# Patient Record
Sex: Female | Born: 1985
Health system: Southern US, Community
[De-identification: ages and names within clinical notes are randomized; demographics above are authoritative.]

## PROBLEM LIST (undated history)

## (undated) DIAGNOSIS — F419 Anxiety disorder, unspecified: Secondary | ICD-10-CM

## (undated) DIAGNOSIS — R252 Cramp and spasm: Secondary | ICD-10-CM

## (undated) DIAGNOSIS — M545 Low back pain, unspecified: Secondary | ICD-10-CM

## (undated) DIAGNOSIS — I2699 Other pulmonary embolism without acute cor pulmonale: Secondary | ICD-10-CM

## (undated) DIAGNOSIS — M5126 Other intervertebral disc displacement, lumbar region: Secondary | ICD-10-CM

## (undated) DIAGNOSIS — D649 Anemia, unspecified: Secondary | ICD-10-CM

## (undated) DIAGNOSIS — I82409 Acute embolism and thrombosis of unspecified deep veins of unspecified lower extremity: Secondary | ICD-10-CM

## (undated) DIAGNOSIS — G8929 Other chronic pain: Secondary | ICD-10-CM

---

## 2005-11-20 ENCOUNTER — Other Ambulatory Visit: Admission: RE | Admit: 2005-11-20 | Discharge: 2005-11-20 | Payer: Self-pay | Admitting: Obstetrics and Gynecology

## 2012-03-17 ENCOUNTER — Emergency Department (HOSPITAL_COMMUNITY)
Admission: EM | Admit: 2012-03-17 | Discharge: 2012-03-17 | Disposition: A | Discharge status: Home / Self Care | Payer: BC Managed Care – PPO | Attending: Emergency Medicine | Admitting: Emergency Medicine

## 2012-03-17 ENCOUNTER — Encounter (HOSPITAL_COMMUNITY): Payer: Self-pay | Admitting: Family Medicine

## 2012-03-17 DIAGNOSIS — N760 Acute vaginitis: Secondary | ICD-10-CM | POA: Insufficient documentation

## 2012-03-17 DIAGNOSIS — A499 Bacterial infection, unspecified: Secondary | ICD-10-CM | POA: Insufficient documentation

## 2012-03-17 DIAGNOSIS — R109 Unspecified abdominal pain: Secondary | ICD-10-CM | POA: Insufficient documentation

## 2012-03-17 DIAGNOSIS — B9689 Other specified bacterial agents as the cause of diseases classified elsewhere: Secondary | ICD-10-CM | POA: Insufficient documentation

## 2012-03-17 DIAGNOSIS — R197 Diarrhea, unspecified: Secondary | ICD-10-CM | POA: Insufficient documentation

## 2012-03-17 LAB — COMPREHENSIVE METABOLIC PANEL
AST: 20 U/L (ref 0–37)
BUN: 5 mg/dL — ABNORMAL LOW (ref 6–23)
CO2: 25 mEq/L (ref 19–32)
Chloride: 103 mEq/L (ref 96–112)
Creatinine, Ser: 0.8 mg/dL (ref 0.50–1.10)
GFR calc Af Amer: >90 mL/min (ref >90)
GFR calc non Af Amer: >90 mL/min (ref >90)
Glucose, Bld: 101 mg/dL — ABNORMAL HIGH (ref 70–99)
Total Bilirubin: 0.3 mg/dL (ref 0.3–1.2)

## 2012-03-17 LAB — CBC
HCT: 42.4 % (ref 36.0–46.0)
MCV: 94.6 fL (ref 78.0–100.0)
RBC: 4.48 MIL/uL (ref 3.87–5.11)
WBC: 9.8 10*3/uL (ref 4.0–10.5)

## 2012-03-17 LAB — URINALYSIS, ROUTINE W REFLEX MICROSCOPIC
Bilirubin Urine: NEGATIVE
Hgb urine dipstick: NEGATIVE
Ketones, ur: 40 mg/dL — AB
Specific Gravity, Urine: 1.016 (ref 1.005–1.030)
pH: 6 (ref 5.0–8.0)

## 2012-03-17 LAB — WET PREP, GENITAL: Trich, Wet Prep: NONE SEEN

## 2012-03-17 LAB — URINE MICROSCOPIC-ADD ON

## 2012-03-17 MED ORDER — METRONIDAZOLE 500 MG PO TABS: 500.0000 mg | ORAL_TABLET | Freq: Two times a day (BID) | ORAL | Status: AC

## 2012-03-17 MED ORDER — DICYCLOMINE HCL 10 MG PO CAPS
20.0000 mg | ORAL_CAPSULE | Freq: Once | ORAL | Status: AC
  Administered 2012-03-17: 20 mg via ORAL
  Filled 2012-03-17: qty 2

## 2012-03-17 MED ORDER — SODIUM CHLORIDE 0.9 % IV BOLUS (SEPSIS)
1000.0000 mL | Freq: Once | INTRAVENOUS | Status: AC
  Administered 2012-03-17: 1000 mL via INTRAVENOUS

## 2012-03-17 MED ORDER — DICYCLOMINE HCL 20 MG PO TABS: 20.0000 mg | ORAL_TABLET | Freq: Four times a day (QID) | ORAL | Status: DC | PRN

## 2012-03-17 NOTE — ED Notes (Signed)
Pt sts abdominal cramping, fever and diarrhea for a few days. sts she took some immodium that helped but still cramping. Unable to eat or drink much.

## 2012-03-17 NOTE — ED Provider Notes (Signed)
History     CSN: [id number]  Arrival date & time 03/17/12  0941   First MD Initiated Contact with Patient 03/17/12 1045      Chief Complaint  Patient presents with  . Abdominal Cramping    (Consider location/radiation/quality/duration/timing/severity/associated sxs/prior treatment) The history is provided by the patient.   the patient is a healthy 26 year old G0 female with no prior abdominal surgeries who presents to the emergency department with a chief complaint of 2 days of watery diarrhea associated with chills and a fever up to 103 when the symptoms began and crampy lower abdominal pain. The pain is intermittent, becoming severe at times and radiates to the remainder of the abdomen. There is been no blood seen in the diarrhea. Fever resolved last night around 11 PM and she has had no antipyretics since that time. She denies any associated nausea, vomiting, dysuria, hematuria, vaginal discharge, vaginal pain. Last menstrual period was 4 weeks ago. Decreased appetite. No aggravating or alleviating factors. Has taken Imodium with some improvement in the diarrhea.  History reviewed. No pertinent past medical history.  History reviewed. No pertinent past surgical history.  History reviewed. No pertinent family history.  History  Substance Use Topics  . Smoking status: Never Smoker   . Smokeless tobacco: Not on file  . Alcohol Use: No     Review of Systems 10 systems reviewed and are negative for acute change except as noted in the HPI.  Allergies  Review of patient's allergies indicates no known allergies.  Home Medications   Current Outpatient Rx  Name Route Sig Dispense Refill  . LEVONORGESTREL-ETHINYL ESTRAD 0.15-30 MG-MCG PO TABS Oral Take 1 tablet by mouth daily.    Marland Kitchen OVER THE COUNTER MEDICATION Oral Take 1 tablet by mouth daily. Iron with vitamin c      BP 122/76  Pulse 98  Temp 98.5 F (36.9 C) (Oral)  Resp 20  SpO2 100%  LMP 02/15/2012  Physical Exam   Nursing note reviewed. Constitutional: She appears well-developed and well-nourished. No distress.       Vital signs are reviewed and are normal.   HENT:  Head: Normocephalic.       MMM  Eyes: Pupils are equal, round, and reactive to light.  Neck: Neck supple.  Cardiovascular: Normal rate, regular rhythm and normal heart sounds.        Bilateral radial and DP pulses are 2+   Pulmonary/Chest: Effort normal and breath sounds normal. No respiratory distress. She has no wheezes. She exhibits no tenderness.  Abdominal: Soft. Bowel sounds are increased. There is tenderness in the right lower quadrant and periumbilical area. There is no rigidity, no rebound, no guarding and negative Murphy's sign.  Genitourinary: There is no rash, tenderness or lesion on the right labia. There is no rash, tenderness or lesion on the left labia. Uterus is not tender. Cervix exhibits discharge (small amt mucoid discharge seen at closed os). Cervix exhibits no motion tenderness and no friability. Right adnexum displays no mass and no tenderness. Left adnexum displays no mass and no tenderness. No tenderness or bleeding around the vagina. No vaginal discharge found.  Musculoskeletal: She exhibits no edema.  Neurological: She is alert.  Skin: Skin is warm and dry.  Psychiatric: She has a normal mood and affect.    ED Course  Procedures (including critical care time)  Labs Reviewed  URINALYSIS, ROUTINE W REFLEX MICROSCOPIC - Abnormal; Notable for the following:    APPearance CLOUDY (*)  Ketones, ur 40 (*)     Protein, ur 30 (*)     Leukocytes, UA SMALL (*)     All other components within normal limits  COMPREHENSIVE METABOLIC PANEL - Abnormal; Notable for the following:    Glucose, Bld 101 (*)     BUN 5 (*)     All other components within normal limits  WET PREP, GENITAL - Abnormal; Notable for the following:    Clue Cells Wet Prep HPF POC MODERATE (*)     WBC, Wet Prep HPF POC MODERATE (*)     All  other components within normal limits  POCT PREGNANCY, URINE  CBC  URINE MICROSCOPIC-ADD ON  GC/CHLAMYDIA PROBE AMP, GENITAL   No results found.   1. Abdominal pain   2. Diarrhea   3. Bacterial vaginosis       MDM  Healthy young F with 2 days diarrhea and intermittent abd cramping, resolved fever. No N/V. On initial exam, moderate and RLQ TTP- no changes on re-exam. No significant findings on pelvic examination, though wet prep shows bacterial vaginosis. No UTI or hematuria. No leukocytosis or anemia. Electrolytes WNL. 1L NS in ED. Bentyl given and appears to have helped a small amount with generalized cramps though pt still with TTP.  Abd remains soft without rebound or guarding. No diarrhea in ED. Discussed at length with pt and family possibility of appendicitis vs pure diarrheal illness and risks vs benefits of CT scan. They are comfortable with d/c home and 24 hour recheck if still with symptoms. Strict return precautions discussed- pt and family voice understanding. Has tolerated PO clears in ED.        Shaaron Adler, PA-C 03/17/12 1408

## 2012-03-17 NOTE — Discharge Instructions (Signed)
As we discussed, you should be seen again in 24 hours if your symptoms continue, sooner if they worsen. Stick to clear liquids in the meantime. You can use the bentyl as needed every 6 hours for abdominal cramping. You can also use the imodium for diarrhea as needed.     Abdominal Pain Abdominal pain can be caused by many things. Your caregiver decides the seriousness of your pain by an examination and possibly blood tests and X-rays. Many cases can be observed and treated at home. Most abdominal pain is not caused by a disease and will probably improve without treatment. However, in many cases, more time must pass before a clear cause of the pain can be found. Before that point, it may not be known if you need more testing, or if hospitalization or surgery is needed. HOME CARE INSTRUCTIONS   Do not take laxatives unless directed by your caregiver.   Take pain medicine only as directed by your caregiver.   Only take over-the-counter or prescription medicines for pain, discomfort, or fever as directed by your caregiver.   Try a clear liquid diet (broth, tea, or water) for as long as directed by your caregiver. Slowly move to a bland diet as tolerated.  SEEK IMMEDIATE MEDICAL CARE IF:   The pain does not go away.   You have a fever.   You keep throwing up (vomiting).   The pain is felt only in portions of the abdomen. Pain in the right side could possibly be appendicitis. In an adult, pain in the left lower portion of the abdomen could be colitis or diverticulitis.   You pass bloody or black tarry stools.  MAKE SURE YOU:   Understand these instructions.   Will watch your condition.   Will get help right away if you are not doing well or get worse.  Document Released: 06/28/2005 Document Revised: 09/07/2011 Document Reviewed: 05/06/2008 [facility] Patient Information 2012 [place], [place].         Diarrhea Infections caused by germs (bacterial) or a virus commonly cause  diarrhea. Your caregiver has determined that with time, rest and fluids, the diarrhea should improve. In general, eat normally while drinking more water than usual. Although water may prevent dehydration, it does not contain salt and minerals (electrolytes). Broths, weak tea without caffeine and oral rehydration solutions (ORS) replace fluids and electrolytes. Small amounts of fluids should be taken frequently. Large amounts at one time may not be tolerated. Plain water may be harmful in infants and the elderly. Oral rehydrating solutions (ORS) are available at pharmacies and grocery stores. ORS replace water and important electrolytes in proper proportions. Sports drinks are not as effective as ORS and may be harmful due to sugars worsening diarrhea.  ORS is especially recommended for use in children with diarrhea. As a general guideline for children, replace any new fluid losses from diarrhea and/or vomiting with ORS as follows:   If your child weighs 22 pounds or under (10 kg or less), give 60-120 mL ( -  cup or 2 - 4 ounces) of ORS for each episode of diarrheal stool or vomiting episode.   If your child weighs more than 22 pounds (more than 10 kgs), give 120-240 mL ( - 1 cup or 4 - 8 ounces) of ORS for each diarrheal stool or episode of vomiting.   While correcting for dehydration, children should eat normally. However, foods high in sugar should be avoided because this may worsen diarrhea. Large amounts of carbonated soft  drinks, juice, gelatin desserts and other highly sugared drinks should be avoided.   After correction of dehydration, other liquids that are appealing to the child may be added. Children should drink small amounts of fluids frequently and fluids should be increased as tolerated. Children should drink enough fluids to keep urine clear or pale yellow.   Adults should eat normally while drinking more fluids than usual. Drink small amounts of fluids frequently and increase as  tolerated. Drink enough fluids to keep urine clear or pale yellow. Broths, weak decaffeinated tea, lemon lime soft drinks (allowed to go flat) and ORS replace fluids and electrolytes.   Avoid:   Carbonated drinks.   Juice.   Extremely hot or cold fluids.   Caffeine drinks.   Fatty, greasy foods.   Alcohol.   Tobacco.   Too much intake of anything at one time.   Gelatin desserts.   Probiotics are active cultures of beneficial bacteria. They may lessen the amount and number of diarrheal stools in adults. Probiotics can be found in yogurt with active cultures and in supplements.   Wash hands well to avoid spreading bacteria and virus.   Anti-diarrheal medications are not recommended for infants and children.   Only take over-the-counter or prescription medicines for pain, discomfort or fever as directed by your caregiver. Do not give aspirin to children because it may cause Reye's Syndrome.   For adults, ask your caregiver if you should continue all prescribed and over-the-counter medicines.   If your caregiver has given you a follow-up appointment, it is very important to keep that appointment. Not keeping the appointment could result in a chronic or permanent injury, and disability. If there is any problem keeping the appointment, you must call back to this facility for assistance.  SEEK IMMEDIATE MEDICAL CARE IF:   You or your child is unable to keep fluids down or other symptoms or problems become worse in spite of treatment.   Vomiting or diarrhea develops and becomes persistent.   There is vomiting of blood or bile (green material).   There is blood in the stool or the stools are black and tarry.   There is no urine output in 6-8 hours or there is only a small amount of very dark urine.   Abdominal pain develops, increases or localizes.   You have a fever.   Your baby is older than 3 months with a rectal temperature of 102 F (38.9 C) or higher.   Your baby is 20  months old or younger with a rectal temperature of 100.4 F (38 C) or higher.   You or your child develops excessive weakness, dizziness, fainting or extreme thirst.   You or your child develops a rash, stiff neck, severe headache or become irritable or sleepy and difficult to awaken.  MAKE SURE YOU:   Understand these instructions.   Will watch your condition.   Will get help right away if you are not doing well or get worse.  Document Released: 09/08/2002 Document Revised: 09/07/2011 Document Reviewed: 07/26/2009 [facility] Patient Information 2012 [place], [place].         Bacterial Vaginosis Bacterial vaginosis (BV) is a vaginal infection where the normal balance of bacteria in the vagina is disrupted. The normal balance is then replaced by an overgrowth of certain bacteria. There are several different kinds of bacteria that can cause BV. BV is the most common vaginal infection in women of childbearing age. CAUSES   The cause of BV is not  fully understood. BV develops when there is an increase or imbalance of harmful bacteria.   Some activities or behaviors can upset the normal balance of bacteria in the vagina and put women at increased risk including:   Having a new sex partner or multiple sex partners.   Douching.   Using an intrauterine device (IUD) for contraception.   It is not clear what role sexual activity plays in the development of BV. However, women that have never had sexual intercourse are rarely infected with BV.  Women do not get BV from toilet seats, bedding, swimming pools or from touching objects around them.  SYMPTOMS   Grey vaginal discharge.   A fish-like odor with discharge, especially after sexual intercourse.   Itching or burning of the vagina and vulva.   Burning or pain with urination.   Some women have no signs or symptoms at all.  DIAGNOSIS  Your caregiver must examine the vagina for signs of BV. Your caregiver will perform lab tests  and look at the sample of vaginal fluid through a microscope. They will look for bacteria and abnormal cells (clue cells), a pH test higher than 4.5, and a positive amine test all associated with BV.  RISKS AND COMPLICATIONS   Pelvic inflammatory disease (PID).   Infections following gynecology surgery.   Developing HIV.   Developing herpes virus.  TREATMENT  Sometimes BV will clear up without treatment. However, all women with symptoms of BV should be treated to avoid complications, especially if gynecology surgery is planned. Female partners generally do not need to be treated. However, BV may spread between female sex partners so treatment is helpful in preventing a recurrence of BV.   BV may be treated with antibiotics. The antibiotics come in either pill or vaginal cream forms. Either can be used with nonpregnant or pregnant women, but the recommended dosages differ. These antibiotics are not harmful to the baby.   BV can recur after treatment. If this happens, a second round of antibiotics will often be prescribed.   Treatment is important for pregnant women. If not treated, BV can cause a premature delivery, especially for a pregnant woman who had a premature birth in the past. All pregnant women who have symptoms of BV should be checked and treated.   For chronic reoccurrence of BV, treatment with a type of prescribed gel vaginally twice a week is helpful.  HOME CARE INSTRUCTIONS   Finish all medication as directed by your caregiver.   Do not have sex until treatment is completed.   Tell your sexual partner that you have a vaginal infection. They should see their caregiver and be treated if they have problems, such as a mild rash or itching.   Practice safe sex. Use condoms. Only have 1 sex partner.  PREVENTION  Basic prevention steps can help reduce the risk of upsetting the natural balance of bacteria in the vagina and developing BV:  Do not have sexual intercourse (be  abstinent).   Do not douche.   Use all of the medicine prescribed for treatment of BV, even if the signs and symptoms go away.   Tell your sex partner if you have BV. That way, they can be treated, if needed, to prevent reoccurrence.  SEEK MEDICAL CARE IF:   Your symptoms are not improving after 3 days of treatment.   You have increased discharge, pain, or fever.  MAKE SURE YOU:   Understand these instructions.   Will watch your condition.  Will get help right away if you are not doing well or get worse.  FOR MORE INFORMATION  Division of STD Prevention (DSTDP), Centers for Disease Control and Prevention: [employer] American Social Health Association (ASHA): www.ashastd.org  Document Released: 09/18/2005 Document Revised: 09/07/2011 Document Reviewed: 03/11/2009 [facility] Patient Information 2012 [place], [place].

## 2012-03-18 LAB — GC/CHLAMYDIA PROBE AMP, GENITAL
Chlamydia, DNA Probe: NEGATIVE
GC Probe Amp, Genital: NEGATIVE

## 2012-03-19 ENCOUNTER — Encounter (HOSPITAL_COMMUNITY): Payer: Self-pay | Admitting: Emergency Medicine

## 2012-03-19 ENCOUNTER — Emergency Department (HOSPITAL_COMMUNITY)
Admission: EM | Admit: 2012-03-19 | Discharge: 2012-03-19 | Disposition: A | Discharge status: Home / Self Care | Payer: BC Managed Care – PPO | Attending: Emergency Medicine | Admitting: Emergency Medicine

## 2012-03-19 DIAGNOSIS — R197 Diarrhea, unspecified: Secondary | ICD-10-CM

## 2012-03-19 DIAGNOSIS — N390 Urinary tract infection, site not specified: Secondary | ICD-10-CM | POA: Insufficient documentation

## 2012-03-19 LAB — URINALYSIS, DIPSTICK ONLY
Ketones, ur: 40 mg/dL — AB
Nitrite: POSITIVE — AB
Protein, ur: 30 mg/dL — AB
Urobilinogen, UA: 0.2 mg/dL (ref 0.0–1.0)
pH: 6 (ref 5.0–8.0)

## 2012-03-19 LAB — CBC
HCT: 38.6 % (ref 36.0–46.0)
MCHC: 35.2 g/dL (ref 30.0–36.0)
MCV: 92.6 fL (ref 78.0–100.0)
RDW: 11.9 % (ref 11.5–15.5)

## 2012-03-19 LAB — BASIC METABOLIC PANEL
Chloride: 102 mEq/L (ref 96–112)
Creatinine, Ser: 0.8 mg/dL (ref 0.50–1.10)
GFR calc Af Amer: >90 mL/min (ref >90)

## 2012-03-19 MED ORDER — CIPROFLOXACIN HCL 500 MG PO TABS
500.0000 mg | ORAL_TABLET | Freq: Once | ORAL | Status: AC
  Administered 2012-03-19: 500 mg via ORAL
  Filled 2012-03-19: qty 1

## 2012-03-19 MED ORDER — CIPROFLOXACIN HCL 500 MG PO TABS: 500.0000 mg | ORAL_TABLET | Freq: Two times a day (BID) | ORAL | Status: AC

## 2012-03-19 MED ORDER — SODIUM CHLORIDE 0.9 % IV BOLUS (SEPSIS)
1000.0000 mL | Freq: Once | INTRAVENOUS | Status: AC
  Administered 2012-03-19: 1000 mL via INTRAVENOUS

## 2012-03-19 NOTE — ED Provider Notes (Signed)
Medical screening examination/treatment/procedure(s) were conducted as a shared visit with non-physician practitioner(s) and myself.  I personally evaluated the patient during the encounter   Glynn Octave, MD 03/19/12 [phone]

## 2012-03-19 NOTE — ED Provider Notes (Signed)
Medical screening examination/treatment/procedure(s) were performed by non-physician practitioner and as supervising physician I was immediately available for consultation/collaboration.  Luan Maberry R. Sheletha Bow, MD 03/19/12 1459 

## 2012-03-19 NOTE — Discharge Instructions (Signed)
Continue taking your Flagyl. Start taking Cipro.  Take Bentyl as needed for abdominal cramping. Bloody Diarrhea Bloody diarrhea can be caused by many different conditions. Most of the time bloody diarrhea is the result of food poisoning or minor infections. Bloody diarrhea usually improves over 2 to 3 days of rest and fluid replacement. Other conditions that can cause bloody diarrhea include:  Internal bleeding.   Infection.   Diseases of the bowel and colon.  Internal bleeding from an ulcer or bowel disease can be severe and requires hospital care or even surgery. DIAGNOSIS  To find out what is wrong your caregiver may check your:  Stool.   Blood.   Results from a test that looks inside the body (endoscopy).  TREATMENT   Get plenty of rest.   Drink enough water and fluids to keep your urine clear or pale yellow.   Do not smoke.   Solid foods and dairy products should be avoided until your illness improves.   As you improve, slowly return to a regular diet with easily-digested foods first. Examples are:   Bananas.   Rice.   Toast.   Crackers.  You should only need these for about 2 days before adding more normal foods to your diet.  Avoid spicy or fatty foods as well as caffeine and alcohol for several days.   Medicine to control cramping and diarrhea can relieve symptoms but may prolong some cases of bloody diarrhea. Antibiotics can speed recovery from diarrhea due to some bacterial infections. Call your caregiver if diarrhea does not get better in 3 days.  SEEK MEDICAL CARE IF:   You do not improve after 3 days.   Your diarrhea improves but your stool appears black.  SEEK IMMEDIATE MEDICAL CARE IF:   You become extremely weak or faint.   You become very sweaty.   You have increased pain or bleeding.   You develop repeated vomiting.   You vomit and you see blood or the vomit looks black in color.   You have a fever.  Document Released: 09/18/2005 Document  Revised: 09/07/2011 Document Reviewed: 08/20/2009 [facility] Patient Information 2012 [place], [place].

## 2012-03-19 NOTE — ED Notes (Signed)
PA-C at bedside 

## 2012-03-19 NOTE — ED Provider Notes (Signed)
1:00 PM Assumed care of patient in the CDU from Dr. Manus Gunning and Dr. Rainey Pines.  Patient comes in today with a chief complaint of abdominal cramping and diarrhea that has been present for 4 days.  Today diarrhea became bloody.  Labs unremarkable.  UA showed UTI.  Plan is for patient to be hydrated and discharged home with prescription for Cipro.  Patient was seen in the ED four days ago for similar symptoms and started on Flagyl for BV and given bentyl for abdominal cramping.    3:00 PM Reassessed patient.  She reports that her abdominal cramping has improved.  Patient has been given 1 Liter IVF.  Patient in no acute distress, Heart RRR, Lungs CTAB, abdomen soft and nontender.  Stool cultures and c.diff have been sent.  Feel that patient can be discharged home.  Patient in agreement with the plan.  Pascal Lux [place], PA-C 03/19/12 1541

## 2012-03-19 NOTE — ED Provider Notes (Signed)
History     CSN: [id number]  Arrival date & time 03/19/12  7829   None     Chief Complaint  Patient presents with  . Abdominal Pain    (Consider location/radiation/quality/duration/timing/severity/associated sxs/prior treatment) HPI  26 year old female four-day history of abdominal cramping and multiple bouts of diarrhea. Seen 48 hours ago, given metronidazole to take after stomach upset resolved and also Bentyl. Patient reports her fevers have been ongoing, and she has noticed blood in her stool for the last 24 hours. She's had approximately 15 loose stools in the past 24 hours. She feels she got worse today because she was able to eat yesterday. She is again anorexic today. She denies any frank abdominal pain, just cramping. She denies any dysuria or vaginal discharge. She began her period yesterday.  She started her Flagyl last night.  On arrival vital signs were normal.  History reviewed. No pertinent past medical history.  History reviewed. No pertinent past surgical history.  No family history on file.  History  Substance Use Topics  . Smoking status: Never Smoker   . Smokeless tobacco: Not on file  . Alcohol Use: No    OB History    Grav Para Term Preterm Abortions TAB SAB Ect Mult Living                  Review of Systems Constitutional: Negative for fever and chills.  HENT: Negative for ear pain, sore throat and trouble swallowing.   Eyes: Negative for pain and visual disturbance.  Respiratory: Negative for cough and shortness of breath.   Cardiovascular: Negative for chest pain and leg swelling.  Gastrointestinal: Negative for nausea, vomiting, POS abdominal cramping and diarrhea.  Genitourinary: Negative for dysuria, urgency and frequency.  Musculoskeletal: Negative for back pain and joint swelling.  Skin: Negative for rash and wound.  Neurological: Negative for dizziness, syncope, speech difficulty, weakness and numbness.   Allergies  Review of  patient's allergies indicates no known allergies.  Home Medications   Current Outpatient Rx  Name Route Sig Dispense Refill  . DICYCLOMINE HCL 20 MG PO TABS Oral Take 20 mg by mouth every 6 (six) hours as needed. For abdominal cramping.    Marland Kitchen LEVONORGESTREL-ETHINYL ESTRAD 0.15-30 MG-MCG PO TABS Oral Take 1 tablet by mouth daily.    Marland Kitchen METRONIDAZOLE 500 MG PO TABS Oral Take 1 tablet (500 mg total) by mouth 2 (two) times daily. 14 tablet 0  . OVER THE COUNTER MEDICATION Oral Take 1 tablet by mouth daily. Iron with vitamin c    . CIPROFLOXACIN HCL 500 MG PO TABS Oral Take 1 tablet (500 mg total) by mouth 2 (two) times daily. 20 tablet 0    BP 116/71  Pulse 88  Temp 99.6 F (37.6 C)  Resp 20  SpO2 99%  LMP 02/15/2012  Physical Exam Consitutional: Pt in no acute distress.   Head: Normocephalic and atraumatic.  Eyes: Extraocular motion intact, no scleral icterus Neck: Supple without meningismus, mass, or overt JVD Respiratory: Effort normal and breath sounds normal. No respiratory distress. CV: Heart regular rate and regular rhythm (sinus), no obvious murmurs.  Pulses +2 and symmetric Abdomen: Soft, generalized mild abdominal tenderness to palpation, non-distended. No rebound or guarding. No focal tenderness anywhere. MSK: Extremities are atraumatic without deformity, ROM intact Skin: Warm, dry, intact Neuro: Alert and oriented, no motor deficit noted.   Psychiatric: Mood and affect are normal    ED Course  Procedures (including critical care time)  Labs  Reviewed  BASIC METABOLIC PANEL - Abnormal; Notable for the following:    BUN 5 (*)     All other components within normal limits  URINALYSIS, DIPSTICK ONLY - Abnormal; Notable for the following:    Bilirubin Urine SMALL (*)     Ketones, ur 40 (*)     Protein, ur 30 (*)     Nitrite POSITIVE (*)     Leukocytes, UA SMALL (*)     All other components within normal limits  CBC  CLOSTRIDIUM DIFFICILE BY PCR  STOOL CULTURE    No results found.   1. Diarrhea   2. UTI (lower urinary tract infection)       MDM  Not at all consistent with appendicitis. Nonlateralizing discomfort. Generalized crampy pain, and generalized very mild tenderness to palpation. Not consistent with GU disease. Laboratories, stool cultures, antibiotics.  Urine has resulted suggesting urinary tract infection. Patient also has ketones in her urine. We'll treat with IV fluids as well as ciprofloxacin. Patient to the CDU for further treatment and management.         Larrie Kass, MD 03/19/12 [phone]

## 2012-03-19 NOTE — ED Notes (Signed)
Pt unable to have BM at this time.  States will try later.

## 2012-03-19 NOTE — ED Notes (Signed)
Discharged via teach back 

## 2012-03-19 NOTE — ED Notes (Signed)
Pt. Stated, pt. Here on Sun with the same symptoms.  Abdominal pain, and diarrhea

## 2012-03-21 NOTE — ED Provider Notes (Signed)
I saw and evaluated the patient, reviewed the resident's note and I agree with the findings and plan.  4 days of abdominal cramping and diarrhea, today with streaks of blood. Abdomen soft. Mucus membranes moist.  Glynn Octave, MD 03/21/12 [phone]

## 2012-03-23 LAB — STOOL CULTURE

## 2013-06-19 ENCOUNTER — Other Ambulatory Visit: Payer: Self-pay | Admitting: Chiropractic Medicine

## 2013-06-19 DIAGNOSIS — M545 Low back pain, unspecified: Secondary | ICD-10-CM

## 2013-06-24 ENCOUNTER — Ambulatory Visit
Admission: RE | Admit: 2013-06-24 | Discharge: 2013-06-24 | Disposition: A | Discharge status: Home / Self Care | Payer: BC Managed Care – PPO | Source: Ambulatory Visit | Attending: Chiropractic Medicine | Admitting: Chiropractic Medicine

## 2013-06-24 DIAGNOSIS — M545 Low back pain, unspecified: Secondary | ICD-10-CM

## 2014-10-12 ENCOUNTER — Encounter: Payer: Self-pay | Admitting: *Deleted

## 2015-07-03 LAB — HM PAP SMEAR

## 2015-11-14 NOTE — H&P (Addendum)
Catalina Jerilynn Mages April is an 30 y.o. female with ovarian mass presents for surgical management.    No past medical history on file.  Past Surgical History  Procedure Laterality Date  . No past surgeries      Family History  Problem Relation Age of Onset  . Atrial fibrillation Father   . Atrial fibrillation Paternal Grandmother     Social History:  reports that she has never smoked. She has never used smokeless tobacco. She reports that she does not drink alcohol or use illicit drugs.  Allergies: No Known Allergies  Meds:  Celexa, loloestrin  ROS  AF, VSS  Physical Exam  Gen - NAD ABd - soft, NT/ND Ext - NT, no edema PV - uterus mobile NT.  No definite mass palpated, possible right adnexal fullness  PV [place]:  6.4x5cm adnexal mass to the right of midline, suggestive of dermoid.  Assessment/Plan: Adnexal mass, possible dermoid Plan for exploratory laparotomy, ovarian cystectomy, possible oophorectomy - suspect right R/b/a discussed, questions answered, informed consent  Ameah Chanda 11/14/2015, 12:07 PM

## 2015-11-18 NOTE — Patient Instructions (Signed)
Your procedure is scheduled on:  Wednesday, Feb. 22, 2017  Enter through the [employer] [facility] at: 6:00 A.M.  Pick up the phone at the desk and dial [phone].  Call this number if you have problems the morning of surgery: [phone].  Remember:  Do NOT eat food or drink after:  Midnight Tuesday  Take these medicines the morning of surgery with a SIP OF WATER: Celexa  Do NOT wear jewelry (body piercing), metal hair clips/bobby pins, make-up, or nail polish. Do NOT wear lotions, powders, or perfumes.  You may wear deoderant. Do NOT shave for 48 hours prior to surgery. Do NOT bring valuables to the hospital. Contacts, dentures, or bridgework may not be worn into surgery.  Leave suitcase in car.  After surgery it may be brought to your room.  For patients admitted to the hospital, checkout time is 11:00 AM the day of discharge.

## 2015-11-19 ENCOUNTER — Encounter (HOSPITAL_COMMUNITY): Payer: Self-pay

## 2015-11-19 ENCOUNTER — Encounter (HOSPITAL_COMMUNITY)
Admission: RE | Admit: 2015-11-19 | Discharge: 2015-11-19 | Disposition: A | Discharge status: Home / Self Care | Payer: BLUE CROSS/BLUE SHIELD | Source: Ambulatory Visit | Attending: Obstetrics and Gynecology | Admitting: Obstetrics and Gynecology

## 2015-11-19 DIAGNOSIS — Z01812 Encounter for preprocedural laboratory examination: Secondary | ICD-10-CM | POA: Diagnosis not present

## 2015-11-19 HISTORY — DX: Anemia, unspecified: D64.9

## 2015-11-19 HISTORY — DX: Anxiety disorder, unspecified: F41.9

## 2015-11-19 LAB — TYPE AND SCREEN
ABO/RH(D): A NEG
Antibody Screen: NEGATIVE

## 2015-11-19 LAB — CBC
HEMATOCRIT: 39.8 % (ref 36.0–46.0)
HEMOGLOBIN: 13.5 g/dL (ref 12.0–15.0)
MCH: 32.9 pg (ref 26.0–34.0)
MCHC: 33.9 g/dL (ref 30.0–36.0)
MCV: 97.1 fL (ref 78.0–100.0)
Platelets: 253 10*3/uL (ref 150–400)
RBC: 4.1 MIL/uL (ref 3.87–5.11)
RDW: 12.4 % (ref 11.5–15.5)
WBC: 5.3 10*3/uL (ref 4.0–10.5)

## 2015-11-19 LAB — ABO/RH: ABO/RH(D): A NEG

## 2015-11-23 ENCOUNTER — Encounter (HOSPITAL_COMMUNITY): Payer: Self-pay | Admitting: Anesthesiology

## 2015-11-23 MED ORDER — CEFOTETAN DISODIUM 2 G IJ SOLR
2.0000 g | INTRAMUSCULAR | Status: AC
  Administered 2015-11-24: 2 g via INTRAVENOUS
  Filled 2015-11-23: qty 2

## 2015-11-23 NOTE — Anesthesia Preprocedure Evaluation (Addendum)
Anesthesia Evaluation  Patient identified by MRN, date of birth, ID band Patient awake    Reviewed: Allergy & Precautions, H&P , NPO status , Patient's Chart, lab work & pertinent test results  Airway Mallampati: I  TM Distance: >3 FB Neck ROM: full    Dental no notable dental hx. (+) Teeth Intact   Pulmonary neg pulmonary ROS,    Pulmonary exam normal        Cardiovascular negative cardio ROS Normal cardiovascular exam     Neuro/Psych negative neurological ROS     GI/Hepatic negative GI ROS, Neg liver ROS,   Endo/Other  negative endocrine ROS  Renal/GU negative Renal ROS     Musculoskeletal   Abdominal (+) + obese,   Peds  Hematology   Anesthesia Other Findings   Reproductive/Obstetrics negative OB ROS                             Anesthesia Physical Anesthesia Plan  ASA: II  Anesthesia Plan: General   Post-op Pain Management:    Induction: Intravenous  Airway Management Planned: Oral ETT  Additional Equipment:   Intra-op Plan:   Post-operative Plan: Extubation in OR  Informed Consent: I have reviewed the patients History and Physical, chart, labs and discussed the procedure including the risks, benefits and alternatives for the proposed anesthesia with the patient or authorized representative who has indicated his/her understanding and acceptance.     Plan Discussed with: CRNA and Surgeon  Anesthesia Plan Comments:        Anesthesia Quick Evaluation

## 2015-11-24 ENCOUNTER — Encounter (HOSPITAL_COMMUNITY)
Admission: RE | Disposition: A | Discharge status: Home / Self Care | Payer: Self-pay | Source: Ambulatory Visit | Attending: Obstetrics and Gynecology

## 2015-11-24 ENCOUNTER — Inpatient Hospital Stay (HOSPITAL_COMMUNITY): Payer: BLUE CROSS/BLUE SHIELD | Admitting: Anesthesiology

## 2015-11-24 ENCOUNTER — Inpatient Hospital Stay (HOSPITAL_COMMUNITY)
Admission: RE | Admit: 2015-11-24 | Discharge: 2015-11-26 | DRG: 743 | Disposition: A | Discharge status: Home / Self Care | Payer: BLUE CROSS/BLUE SHIELD | Source: Ambulatory Visit | Attending: Obstetrics and Gynecology | Admitting: Obstetrics and Gynecology

## 2015-11-24 ENCOUNTER — Encounter (HOSPITAL_COMMUNITY): Payer: Self-pay | Admitting: Anesthesiology

## 2015-11-24 DIAGNOSIS — D369 Benign neoplasm, unspecified site: Secondary | ICD-10-CM | POA: Diagnosis present

## 2015-11-24 DIAGNOSIS — D270 Benign neoplasm of right ovary: Principal | ICD-10-CM | POA: Diagnosis present

## 2015-11-24 DIAGNOSIS — N839 Noninflammatory disorder of ovary, fallopian tube and broad ligament, unspecified: Secondary | ICD-10-CM | POA: Diagnosis present

## 2015-11-24 HISTORY — PX: DERMOID CYST  EXCISION: SHX1452

## 2015-11-24 HISTORY — PX: LAPAROTOMY: SHX154

## 2015-11-24 LAB — PREGNANCY, URINE: PREG TEST UR: NEGATIVE

## 2015-11-24 SURGERY — LAPAROTOMY
Anesthesia: General | Laterality: Right

## 2015-11-24 MED ORDER — PROPOFOL 10 MG/ML IV BOLUS
INTRAVENOUS | Status: DC | PRN
  Administered 2015-11-24: 20 mg via INTRAVENOUS
  Administered 2015-11-24: 180 mg via INTRAVENOUS

## 2015-11-24 MED ORDER — SCOPOLAMINE 1 MG/3DAYS TD PT72
MEDICATED_PATCH | TRANSDERMAL | Status: AC
  Administered 2015-11-24: 1.5 mg via TRANSDERMAL
  Filled 2015-11-24: qty 1

## 2015-11-24 MED ORDER — MEPERIDINE HCL 25 MG/ML IJ SOLN: 6.2500 mg | INTRAMUSCULAR | Status: DC | PRN

## 2015-11-24 MED ORDER — KETOROLAC TROMETHAMINE 30 MG/ML IJ SOLN
INTRAMUSCULAR | Status: DC | PRN
  Administered 2015-11-24: 30 mg via INTRAVENOUS

## 2015-11-24 MED ORDER — LIDOCAINE HCL (CARDIAC) 20 MG/ML IV SOLN
INTRAVENOUS | Status: AC
  Filled 2015-11-24: qty 5

## 2015-11-24 MED ORDER — KETOROLAC TROMETHAMINE 30 MG/ML IJ SOLN
INTRAMUSCULAR | Status: AC
  Filled 2015-11-24: qty 1

## 2015-11-24 MED ORDER — DEXTROSE IN LACTATED RINGERS 5 % IV SOLN
INTRAVENOUS | Status: DC
  Administered 2015-11-24 (×2): via INTRAVENOUS

## 2015-11-24 MED ORDER — OXYCODONE-ACETAMINOPHEN 5-325 MG PO TABS
1.0000 | ORAL_TABLET | ORAL | Status: DC | PRN
  Administered 2015-11-25 – 2015-11-26 (×7): 1 via ORAL
  Filled 2015-11-24 (×7): qty 1

## 2015-11-24 MED ORDER — ROCURONIUM BROMIDE 100 MG/10ML IV SOLN
INTRAVENOUS | Status: AC
  Filled 2015-11-24: qty 1

## 2015-11-24 MED ORDER — FENTANYL CITRATE (PF) 100 MCG/2ML IJ SOLN
INTRAMUSCULAR | Status: DC | PRN
  Administered 2015-11-24 (×3): 50 ug via INTRAVENOUS

## 2015-11-24 MED ORDER — ONDANSETRON HCL 4 MG/2ML IJ SOLN: 4.0000 mg | Freq: Four times a day (QID) | INTRAMUSCULAR | Status: DC | PRN

## 2015-11-24 MED ORDER — PROMETHAZINE HCL 25 MG/ML IJ SOLN: 6.2500 mg | INTRAMUSCULAR | Status: DC | PRN

## 2015-11-24 MED ORDER — NALOXONE HCL 0.4 MG/ML IJ SOLN: 0.4000 mg | INTRAMUSCULAR | Status: DC | PRN

## 2015-11-24 MED ORDER — HYDROMORPHONE HCL 1 MG/ML IJ SOLN
INTRAMUSCULAR | Status: AC
  Administered 2015-11-24: 0.5 mg via INTRAVENOUS
  Filled 2015-11-24: qty 1

## 2015-11-24 MED ORDER — ROCURONIUM BROMIDE 100 MG/10ML IV SOLN
INTRAVENOUS | Status: DC | PRN
  Administered 2015-11-24: 35 mg via INTRAVENOUS

## 2015-11-24 MED ORDER — GLYCOPYRROLATE 0.2 MG/ML IJ SOLN
INTRAMUSCULAR | Status: DC | PRN
  Administered 2015-11-24: 0.6 mg via INTRAVENOUS

## 2015-11-24 MED ORDER — DEXAMETHASONE SODIUM PHOSPHATE 4 MG/ML IJ SOLN
INTRAMUSCULAR | Status: AC
  Filled 2015-11-24: qty 1

## 2015-11-24 MED ORDER — MENTHOL 3 MG MT LOZG: 1.0000 | LOZENGE | OROMUCOSAL | Status: DC | PRN

## 2015-11-24 MED ORDER — ONDANSETRON HCL 4 MG/2ML IJ SOLN
INTRAMUSCULAR | Status: DC | PRN
  Administered 2015-11-24: 4 mg via INTRAVENOUS

## 2015-11-24 MED ORDER — KETOROLAC TROMETHAMINE 30 MG/ML IJ SOLN: 30.0000 mg | Freq: Once | INTRAMUSCULAR | Status: DC

## 2015-11-24 MED ORDER — DIPHENHYDRAMINE HCL 50 MG/ML IJ SOLN
12.5000 mg | Freq: Four times a day (QID) | INTRAMUSCULAR | Status: DC | PRN
  Administered 2015-11-24: 12.5 mg via INTRAVENOUS
  Filled 2015-11-24: qty 1

## 2015-11-24 MED ORDER — GLYCOPYRROLATE 0.2 MG/ML IJ SOLN
INTRAMUSCULAR | Status: AC
  Filled 2015-11-24: qty 3

## 2015-11-24 MED ORDER — MIDAZOLAM HCL 2 MG/2ML IJ SOLN
INTRAMUSCULAR | Status: AC
  Filled 2015-11-24: qty 2

## 2015-11-24 MED ORDER — HYDROMORPHONE HCL 1 MG/ML IJ SOLN
0.2500 mg | INTRAMUSCULAR | Status: DC | PRN
  Administered 2015-11-24: 0.25 mg via INTRAVENOUS
  Administered 2015-11-24 (×2): 0.5 mg via INTRAVENOUS
  Administered 2015-11-24: 0.25 mg via INTRAVENOUS
  Administered 2015-11-24: 0.5 mg via INTRAVENOUS

## 2015-11-24 MED ORDER — HYDROMORPHONE 1 MG/ML IV SOLN
INTRAVENOUS | Status: DC
  Administered 2015-11-24: 6.6 mg via INTRAVENOUS
  Administered 2015-11-24: 3.8 mL via INTRAVENOUS
  Administered 2015-11-24: 10:00:00 via INTRAVENOUS
  Administered 2015-11-25: 6.8 mg via INTRAVENOUS
  Filled 2015-11-24: qty 25

## 2015-11-24 MED ORDER — NEOSTIGMINE METHYLSULFATE 10 MG/10ML IV SOLN
INTRAVENOUS | Status: AC
Start: 2015-11-24 — End: 2015-11-24
  Filled 2015-11-24: qty 1

## 2015-11-24 MED ORDER — CITALOPRAM HYDROBROMIDE 20 MG PO TABS
20.0000 mg | ORAL_TABLET | Freq: Every day | ORAL | Status: DC
  Administered 2015-11-25: 20 mg via ORAL
  Filled 2015-11-24 (×2): qty 1

## 2015-11-24 MED ORDER — PROPOFOL 10 MG/ML IV BOLUS
INTRAVENOUS | Status: AC
  Filled 2015-11-24: qty 20

## 2015-11-24 MED ORDER — 0.9 % SODIUM CHLORIDE (POUR BTL) OPTIME
TOPICAL | Status: DC | PRN
  Administered 2015-11-24: 2000 mL

## 2015-11-24 MED ORDER — SODIUM CHLORIDE 0.9% FLUSH: 9.0000 mL | INTRAVENOUS | Status: DC | PRN

## 2015-11-24 MED ORDER — LIDOCAINE HCL (CARDIAC) 20 MG/ML IV SOLN
INTRAVENOUS | Status: DC | PRN
  Administered 2015-11-24: 30 mg via INTRAVENOUS
  Administered 2015-11-24: 70 mg via INTRAVENOUS

## 2015-11-24 MED ORDER — FENTANYL CITRATE (PF) 250 MCG/5ML IJ SOLN
INTRAMUSCULAR | Status: AC
  Filled 2015-11-24: qty 5

## 2015-11-24 MED ORDER — DEXTROSE-NACL 5-0.45 % IV SOLN: INTRAVENOUS | Status: DC

## 2015-11-24 MED ORDER — NORETHIN-ETH ESTRAD-FE BIPHAS 1 MG-10 MCG / 10 MCG PO TABS
1.0000 | ORAL_TABLET | Freq: Every day | ORAL | Status: DC
  Administered 2015-11-25: 1 via ORAL
  Filled 2015-11-24: qty 1

## 2015-11-24 MED ORDER — KETOROLAC TROMETHAMINE 30 MG/ML IJ SOLN
30.0000 mg | Freq: Four times a day (QID) | INTRAMUSCULAR | Status: AC
  Administered 2015-11-24 (×2): 30 mg via INTRAVENOUS
  Filled 2015-11-24 (×2): qty 1

## 2015-11-24 MED ORDER — HYDROMORPHONE HCL 1 MG/ML IJ SOLN
INTRAMUSCULAR | Status: AC
  Administered 2015-11-24: 0.25 mg via INTRAVENOUS
  Filled 2015-11-24: qty 1

## 2015-11-24 MED ORDER — MIDAZOLAM HCL 2 MG/2ML IJ SOLN
INTRAMUSCULAR | Status: DC | PRN
  Administered 2015-11-24: 1 mg via INTRAVENOUS

## 2015-11-24 MED ORDER — SCOPOLAMINE 1 MG/3DAYS TD PT72
1.0000 | MEDICATED_PATCH | Freq: Once | TRANSDERMAL | Status: DC
  Administered 2015-11-24: 1.5 mg via TRANSDERMAL

## 2015-11-24 MED ORDER — DEXAMETHASONE SODIUM PHOSPHATE 10 MG/ML IJ SOLN
INTRAMUSCULAR | Status: DC | PRN
  Administered 2015-11-24: 4 mg via INTRAVENOUS

## 2015-11-24 MED ORDER — ONDANSETRON HCL 4 MG/2ML IJ SOLN
INTRAMUSCULAR | Status: AC
  Filled 2015-11-24: qty 2

## 2015-11-24 MED ORDER — ONDANSETRON HCL 4 MG PO TABS: 4.0000 mg | ORAL_TABLET | Freq: Four times a day (QID) | ORAL | Status: DC | PRN

## 2015-11-24 MED ORDER — DIPHENHYDRAMINE HCL 12.5 MG/5ML PO ELIX
12.5000 mg | ORAL_SOLUTION | Freq: Four times a day (QID) | ORAL | Status: DC | PRN
  Administered 2015-11-24 – 2015-11-25 (×2): 12.5 mg via ORAL
  Filled 2015-11-24 (×2): qty 5

## 2015-11-24 MED ORDER — NEOSTIGMINE METHYLSULFATE 10 MG/10ML IV SOLN
INTRAVENOUS | Status: DC | PRN
  Administered 2015-11-24: 4 mg via INTRAVENOUS

## 2015-11-24 MED ORDER — LEVONORGESTREL-ETHINYL ESTRAD 0.15-30 MG-MCG PO TABS: 1.0000 | ORAL_TABLET | Freq: Every day | ORAL | Status: DC

## 2015-11-24 MED ORDER — LACTATED RINGERS IV SOLN
INTRAVENOUS | Status: DC
  Administered 2015-11-24 (×2): via INTRAVENOUS

## 2015-11-24 SURGICAL SUPPLY — 33 items
BLADE SURG 10 STRL SS (BLADE) ×6 IMPLANT
CANISTER SUCT 3000ML (MISCELLANEOUS) ×3 IMPLANT
CHLORAPREP W/TINT 26ML (MISCELLANEOUS) ×3 IMPLANT
CLOTH BEACON ORANGE TIMEOUT ST (SAFETY) ×3 IMPLANT
CONT PATH 16OZ SNAP LID 3702 (MISCELLANEOUS) ×3 IMPLANT
DRAPE WARM FLUID 44X44 (DRAPE) ×3 IMPLANT
DRSG OPSITE POSTOP 4X10 (GAUZE/BANDAGES/DRESSINGS) ×3 IMPLANT
GAUZE SPONGE 4X4 16PLY XRAY LF (GAUZE/BANDAGES/DRESSINGS) IMPLANT
GLOVE BIO SURGEON STRL SZ 6.5 (GLOVE) ×3 IMPLANT
GLOVE BIOGEL PI IND STRL 7.0 (GLOVE) ×4 IMPLANT
GLOVE BIOGEL PI INDICATOR 7.0 (GLOVE) ×2
GOWN STRL REUS W/TWL LRG LVL3 (GOWN DISPOSABLE) ×9 IMPLANT
HEMOSTAT SURGICEL 4X8 (HEMOSTASIS) IMPLANT
NS IRRIG 1000ML POUR BTL (IV SOLUTION) ×6 IMPLANT
PACK ABDOMINAL GYN (CUSTOM PROCEDURE TRAY) ×3 IMPLANT
PAD OB MATERNITY 4.3X12.25 (PERSONAL CARE ITEMS) ×3 IMPLANT
PENCIL SMOKE EVAC W/HOLSTER (ELECTROSURGICAL) ×3 IMPLANT
PROTECTOR NERVE ULNAR (MISCELLANEOUS) ×3 IMPLANT
SPONGE LAP 18X18 X RAY DECT (DISPOSABLE) ×6 IMPLANT
STAPLER VISISTAT 35W (STAPLE) IMPLANT
SUT CHROMIC 2 0 TIES 18 (SUTURE) IMPLANT
SUT MON AB-0 CT1 36 (SUTURE) IMPLANT
SUT PDS AB 0 CTX 60 (SUTURE) ×6 IMPLANT
SUT PLAIN 2 0 (SUTURE)
SUT PLAIN 2 0 XLH (SUTURE) IMPLANT
SUT PLAIN ABS 2-0 54XMFL TIE (SUTURE) IMPLANT
SUT VIC AB 0 CT1 18XCR BRD8 (SUTURE) ×4 IMPLANT
SUT VIC AB 0 CT1 8-18 (SUTURE) ×2
SUT VIC AB 4-0 KS 27 (SUTURE) ×3 IMPLANT
SUT VICRYL 0 TIES 12 18 (SUTURE) ×3 IMPLANT
TOWEL OR 17X24 6PK STRL BLUE (TOWEL DISPOSABLE) ×6 IMPLANT
TRAY FOLEY CATH SILVER 14FR (SET/KITS/TRAYS/PACK) ×3 IMPLANT
WATER STERILE IRR 1000ML POUR (IV SOLUTION) ×3 IMPLANT

## 2015-11-24 NOTE — Anesthesia Postprocedure Evaluation (Signed)
Anesthesia Post Note  Patient: Gail Foster  Procedure(s) Performed: Procedure(s) (LRB): LAPAROTOMY RIGHT OVARIAN CYSTECTOMY (Right)  Patient location during evaluation: [facility] Anesthesia Type: General Level of consciousness: awake and alert and oriented Pain management: pain level controlled Vital Signs Assessment: post-procedure vital signs reviewed and stable Respiratory status: spontaneous breathing Cardiovascular status: blood pressure returned to baseline and stable Postop Assessment: adequate PO intake Anesthetic complications: no    Last Vitals:  Filed Vitals:   11/24/15 1300 11/24/15 1341  BP: 115/66   Pulse: 70   Temp:    Resp: 16 16    Last Pain:  Filed Vitals:   11/24/15 1342  PainSc: 3                  Chole Driver R

## 2015-11-24 NOTE — Addendum Note (Signed)
Addendum  created 11/24/15 1353 by Bufford Spikes, CRNA   Modules edited: Clinical Notes   Clinical Notes:  File: [id number]

## 2015-11-24 NOTE — Anesthesia Postprocedure Evaluation (Signed)
Anesthesia Post Note  Patient: Gail Foster  Procedure(s) Performed: Procedure(s) (LRB): LAPAROTOMY RIGHT OVARIAN CYSTECTOMY (Right)  Patient location during evaluation: PACU Anesthesia Type: General Level of consciousness: awake and alert Pain management: pain level controlled Vital Signs Assessment: post-procedure vital signs reviewed and stable Respiratory status: spontaneous breathing, nonlabored ventilation, respiratory function stable and patient connected to nasal cannula oxygen Cardiovascular status: blood pressure returned to baseline and stable Postop Assessment: no signs of nausea or vomiting Anesthetic complications: no    Last Vitals:  Filed Vitals:   11/24/15 0915 11/24/15 0930  BP: 103/66 111/59  Pulse:    Temp:    Resp:      Last Pain:  Filed Vitals:   11/24/15 0931  PainSc: 5                  Jaisa Defino J

## 2015-11-24 NOTE — Anesthesia Procedure Notes (Signed)
Procedure Name: Intubation Date/Time: 11/24/2015 7:29 AM Performed by: Tobin Chad Pre-anesthesia Checklist: Patient identified, Emergency Drugs available, Suction available and Patient being monitored Patient Re-evaluated:Patient Re-evaluated prior to inductionOxygen Delivery Method: Circle system utilized and Simple face mask Preoxygenation: Pre-oxygenation with 100% oxygen Intubation Type: IV induction and Inhalational induction Ventilation: Mask ventilation without difficulty Laryngoscope Size: Mac and 3 Grade View: Grade II Tube type: Oral Tube size: 7.0 mm Number of attempts: 2 Airway Equipment and Method: Stylet Placement Confirmation: ETT inserted through vocal cords under direct vision,  positive ETCO2 and CO2 detector Secured at: 22 cm Tube secured with: Tape Dental Injury: Teeth and Oropharynx as per pre-operative assessment

## 2015-11-24 NOTE — Addendum Note (Signed)
Addendum  created 11/24/15 1350 by Bufford Spikes, CRNA   Modules edited: Clinical Notes   Clinical Notes:  File: [id number]; [place]: [id number]

## 2015-11-24 NOTE — Transfer of Care (Signed)
Immediate Anesthesia Transfer of Care Note  Patient: [facility]  Procedure(s) Performed: Procedure(s): LAPAROTOMY RIGHT OVARIAN CYSTECTOMY (Right)  Patient Location: PACU  Anesthesia Type:General  Level of Consciousness: awake, alert , oriented and patient cooperative  Airway & Oxygen Therapy: Patient Spontanous Breathing and Patient connected to nasal cannula oxygen  Post-op Assessment: Report given to RN and Post -op Vital signs reviewed and stable  Post vital signs: Reviewed and stable  Last Vitals:  Filed Vitals:   11/24/15 0600  BP: 134/91  Pulse: 87  Temp: 36.6 C  Resp: 20    Complications: No apparent anesthesia complications

## 2015-11-24 NOTE — Anesthesia Postprocedure Evaluation (Signed)
Anesthesia Post Note  Patient: KAITHLYN BESA  Procedure(s) Performed: Procedure(s) (LRB): LAPAROTOMY RIGHT OVARIAN CYSTECTOMY (Right)  Patient location during evaluation: Women's Unit Anesthesia Type: General Level of consciousness: awake and alert and oriented Pain management: pain level controlled Vital Signs Assessment: post-procedure vital signs reviewed and stable Respiratory status: spontaneous breathing Cardiovascular status: blood pressure returned to baseline and stable Postop Assessment: adequate PO intake Anesthetic complications: no    Last Vitals:  Filed Vitals:   11/24/15 1300 11/24/15 1341  BP: 115/66   Pulse: 70   Temp:    Resp: 16 16    Last Pain:  Filed Vitals:   11/24/15 1342  PainSc: 3                  Channing Yeager R

## 2015-11-24 NOTE — Progress Notes (Signed)
Day of Surgery Procedure(s) (LRB): LAPAROTOMY RIGHT OVARIAN CYSTECTOMY (Right)  Subjective: Patient reports incisional pain and tolerating PO.    Objective: I have reviewed patient's vital signs, intake and output and medications.  General: alert and cooperative GI: normal findings: soft, non-tender Extremities: extremities normal, atraumatic, no cyanosis or edema  Assessment: s/p Procedure(s): LAPAROTOMY RIGHT OVARIAN CYSTECTOMY (Right): stable  Plan: Advance diet Encourage ambulation  LOS: 0 days    Apryle Stowell 11/24/2015, 1:54 PM

## 2015-11-24 NOTE — Op Note (Signed)
Gail Foster, ONDER NO.:  [id number]  MEDICAL RECORD NO.:  [id number]  LOCATION:  [facility]                          FACILITY:  [facility]  PHYSICIAN:  Marylynn Pearson, MD    DATE OF BIRTH:  [birth year]  DATE OF PROCEDURE: DATE OF DISCHARGE:                              OPERATIVE REPORT   PREOPERATIVE DIAGNOSIS:  Right ovarian mass.  POSTOPERATIVE DIAGNOSIS:  Right ovarian mass, suspect dermoid.  SURGERY:  Laparotomy with right ovarian cystectomy.  SURGEON:  Marylynn Pearson, MD.  ASSISTANT:  Purnell Shoemaker.  ESTIMATED BLOOD LOSS:  Minimal.  URINE OUTPUT:  250 mL, clear.  SPECIMEN:  Right ovarian cyst, suspect dermoid.  COMPLICATIONS:  None.  CONDITION:  Stable to recovery room.  PROCEDURE IN DETAIL:  The patient was taken to the operating room where anesthesia was found to be adequate.  She was placed in the supine position, prepped and draped in sterile fashion, and a Foley catheter was inserted.  A small Pfannenstiel incision was made and extended to the level of the fascia.  The fascia was incised in the midline and extended laterally using curved Mayo scissors.  Underlying rectus muscles were dissected free, and the peritoneum was entered sharply. Right dermoid was identified and delivered through the incision.  A moist laparotomy sponge was placed around the ovary.  A small incision over the ovary was made with a scalpel, and the right ovarian dermoid was shelled out using sharp and blunt dissection.  Small amount of mucinous drainage occurred from the dermoid but captured by the laparotomy sponge.  The base of the ovary was cauterized, any small bleeders were cauterized using the Bovie.  The ovary was reapproximated using Vicryl.  Irrigation was performed. The fascia was closed with PDS.  Subcutaneous tissue was reapproximated with Vicryl, and the skin was closed with 4-0 Vicryl.  Dermabond and skin glue were placed.  The patient was extubated and taken to  the recovery room in stable condition.  Sponge, lap, needle, and instrument counts were correct x2.     Marylynn Pearson, MD     GA/MEDQ  D:  11/24/2015  T:  11/24/2015  Job:  [id number]

## 2015-11-25 ENCOUNTER — Encounter (HOSPITAL_COMMUNITY): Payer: Self-pay | Admitting: Obstetrics and Gynecology

## 2015-11-25 LAB — CBC
HCT: 32.7 % — ABNORMAL LOW (ref 36.0–46.0)
HEMOGLOBIN: 11.1 g/dL — AB (ref 12.0–15.0)
MCH: 32.8 pg (ref 26.0–34.0)
MCHC: 33.9 g/dL (ref 30.0–36.0)
MCV: 96.7 fL (ref 78.0–100.0)
PLATELETS: 200 10*3/uL (ref 150–400)
RBC: 3.38 MIL/uL — ABNORMAL LOW (ref 3.87–5.11)
RDW: 12 % (ref 11.5–15.5)
WBC: 8.3 10*3/uL (ref 4.0–10.5)

## 2015-11-25 MED ORDER — IBUPROFEN 600 MG PO TABS
600.0000 mg | ORAL_TABLET | Freq: Four times a day (QID) | ORAL | Status: DC | PRN
  Administered 2015-11-25 – 2015-11-26 (×4): 600 mg via ORAL
  Filled 2015-11-25 (×4): qty 1

## 2015-11-25 NOTE — Progress Notes (Signed)
1 Day Post-Op Procedure(s) (LRB): LAPAROTOMY RIGHT OVARIAN CYSTECTOMY (Right)  Subjective: Patient reports tolerating PO, + flatus and no problems voiding.    Objective: I have reviewed patient's vital signs, intake and output and medications.  General: alert and cooperative GI: normal findings: soft, non-tender  Assessment: s/p Procedure(s): LAPAROTOMY RIGHT OVARIAN CYSTECTOMY (Right): stable and progressing well  Plan: Advance diet Encourage ambulation Advance to PO medication Discontinue IV fluids  LOS: 1 day    Nicholes Hibler 11/25/2015, 7:45 AM

## 2015-11-26 MED ORDER — IBUPROFEN 600 MG PO TABS: 600.0000 mg | ORAL_TABLET | Freq: Four times a day (QID) | ORAL | Status: DC | PRN

## 2015-11-26 MED ORDER — OXYCODONE-ACETAMINOPHEN 5-325 MG PO TABS: 1.0000 | ORAL_TABLET | ORAL | Status: DC | PRN

## 2015-11-26 NOTE — Discharge Instructions (Signed)
Exploratory Laparotomy, Adult  Exploratory laparotomy is a surgical procedure to examine the organs inside your belly (abdomen). Another name for this is abdominal exploration. You may have this procedure if you have abdominal pain, trauma, bleeding, infection, or obstruction. The procedure may be done if your health care provider cannot make a diagnosis from only an exam and testing.  Exploratory laparotomy may be a planned procedure or an emergency procedure. You may have surgical treatment as part of the laparotomy, or you may have additional treatment after your laparotomy. This will depend on what your surgeon finds during the procedure.  LET YOUR HEALTH CARE PROVIDER KNOW ABOUT:   Any allergies you have.   All medicines you are taking, including vitamins, herbs, eye drops, creams, and over-the-counter medicines.   Previous problems you or members of your family have had with the use of anesthetics.   Any blood disorders you have.   Previous surgeries you have had.   Medical conditions you have.  RISKS AND COMPLICATIONS  Generally, this is a safe procedure. However, problems can occur and include:   Bleeding.   Infection.   A blood clot that forms in your leg and travels to your lungs.   Damage to organs inside your abdomen.   Scar tissue that blocks your digestive tract.  BEFORE THE PROCEDURE   Ask your health care provider about:   Changing or stopping your regular medicines. This is especially important if you are taking diabetes medicines or blood thinners.   Taking medicines such as aspirin and ibuprofen. These medicines can thin your blood. Do not take these medicines before your procedure if your health care provider instructs you not to.   Do not eat or drink anything after midnight on the night before the procedure or as directed by your health care provider.   You may be given instructions for clearing out your bowel before surgery (bowel prep). If you are already in the hospital, the  bowel prep may be done there.  PROCEDURE   An IV tube may be inserted into a vein. You may receive fluids and medicine through the IV tube. This may include antibiotic medicine to treat or prevent infection.   You will be given a medicine that makes you go to sleep (general anesthetic).   You may have a tube placed through your nose and into your stomach (nasogastric tube) to drain your stomach fluids.   You may have a tube placed into your bladder (urinary catheter) to drain urine.   Your abdomen will be cleaned with a germ-killing solution (antiseptic).   The surgeon will make a surgical cut (incision) in your abdomen. This is usually an up-and-down incision in the midsection of your abdomen. The incision will go through the inside lining of your abdomen (peritoneum).   Your surgeon will spread the incision wide enough to examine the inside of your abdomen.   The rest of the procedure will depend on what the surgeon finds:    The surgeon will check all organs in your abdomen for damage or obstruction. Repairs will be made when possible.    If there is blood in the abdomen, the surgeon will look for the source of the bleeding in order to stop it.    If there is yellowish-white fluid (pus) or gastric fluids in your abdomen, the surgeon will check for an infection or a hole (perforation) in your digestive tract.    If the surgeon finds infection, a drain may be   placed to empty fluid that can build up in your abdomen after surgery.    If there is a growth (tumor) inside your abdomen, the surgeon may remove a piece of the growth (biopsy) to examine it under a microscope.   When all procedures are complete, the surgeon will close your abdomen with layers of stitches (sutures).   The incision through the skin of your abdomen will be closed with sutures or staples.  AFTER THE PROCEDURE   Your blood pressure, heart rate, breathing rate, and blood oxygen level will be monitored often until the medicines you were  given have worn off.   You will continue to receive fluids and nutrition through your IV tube. This will stop when you can eat and drink on your own.   You may also get antibiotic medicine and pain medicine through your IV tube.   Your nasogastric tube may be removed when you start to pass gas.   Your urinary catheter may be removed when the anesthetic wears off.     This information is not intended to replace advice given to you by your health care provider. Make sure you discuss any questions you have with your health care provider.     Document Released: 06/13/2001 Document Revised: 10/09/2014 Document Reviewed: 05/06/2014  Elsevier Interactive Patient Education 2016 [employer]

## 2015-11-26 NOTE — Progress Notes (Signed)
Patient discharged home with parents... Discharge instructions reviewed with patient and she verbalized understanding... Condition stable... No equipment... Taken to car via wheelchair by C. Ovid Curd, [place].

## 2015-12-13 NOTE — Discharge Summary (Signed)
Physician Discharge Summary  Patient ID: Gail Foster MRN: [id number] DOB/AGE: 1986/05/06 30 y.o.  Admit date: 11/24/2015 Discharge date: 12/13/2015  Admission Diagnoses:  Discharge Diagnoses:  Active Problems:   Dermoid   Discharged Condition: stable  Hospital Course: Pt was admitted for routine postop care.  She remained afebrile with stable vitals.  POD1, her Hemoglobin was stable and she was able to tolerate PO intake.  Her PCA was d/c and she was advanced to PO meds.  Foley catheter was taken out.  POD2, she was stable for discharge.  She was tolerating a regular diet and pain well controlled.  Consults: None  Significant Diagnostic Studies: labs: cbc  Treatments: surgery: ovarian cystectomy, dermoid. laparotomy  Discharge Exam: Blood pressure 111/83, pulse 67, temperature 98.2 F (36.8 C), temperature source Oral, resp. rate 18, height 5' 9.5" (1.765 m), weight 207 lb (93.895 kg), SpO2 99 %. stable for discharge  Disposition: 01-Home or Self Care     Medication List    STOP taking these medications        acetaminophen 325 MG tablet  Commonly known as:  TYLENOL      TAKE these medications        cetirizine 10 MG tablet  Commonly known as:  ZYRTEC  Take 10 mg by mouth daily.     citalopram 20 MG tablet  Commonly known as:  CELEXA  Take 20 mg by mouth daily.     HAIR/SKIN/NAILS PO  Take 1 tablet by mouth daily.     ibuprofen 600 MG tablet  Commonly known as:  ADVIL,MOTRIN  Take 1 tablet (600 mg total) by mouth every 6 (six) hours as needed for moderate pain.     LO LOESTRIN FE 1 MG-10 MCG / 10 MCG tablet  Generic drug:  Norethindrone-Ethinyl Estradiol-Fe Biphas  Take 1 tablet by mouth daily.     multivitamin with minerals Tabs tablet  Take 1 tablet by mouth daily.     oxyCODONE-acetaminophen 5-325 MG tablet  Commonly known as:  PERCOCET/ROXICET  Take 1-2 tablets by mouth every 4 (four) hours as needed for severe pain (moderate to severe pain (when  tolerating fluids)).     PROBIOTIC PO  Take 1 tablet by mouth 2 (two) times daily.           Follow-up Information    Schedule an appointment as soon as possible for a visit in 2 weeks to follow up.      Signed: Myrikal Messmer 12/13/2015, 7:54 PM

## 2016-02-03 ENCOUNTER — Emergency Department (HOSPITAL_BASED_OUTPATIENT_CLINIC_OR_DEPARTMENT_OTHER): Payer: BLUE CROSS/BLUE SHIELD

## 2016-02-03 ENCOUNTER — Inpatient Hospital Stay (HOSPITAL_BASED_OUTPATIENT_CLINIC_OR_DEPARTMENT_OTHER)
Admission: EM | Admit: 2016-02-03 | Discharge: 2016-02-06 | DRG: 176 | Disposition: A | Discharge status: Home / Self Care | Payer: BLUE CROSS/BLUE SHIELD | Attending: Family Medicine | Admitting: Family Medicine

## 2016-02-03 ENCOUNTER — Encounter (HOSPITAL_BASED_OUTPATIENT_CLINIC_OR_DEPARTMENT_OTHER): Payer: Self-pay | Admitting: *Deleted

## 2016-02-03 DIAGNOSIS — Z86718 Personal history of other venous thrombosis and embolism: Secondary | ICD-10-CM | POA: Insufficient documentation

## 2016-02-03 DIAGNOSIS — Z86711 Personal history of pulmonary embolism: Secondary | ICD-10-CM | POA: Diagnosis present

## 2016-02-03 DIAGNOSIS — Z79899 Other long term (current) drug therapy: Secondary | ICD-10-CM

## 2016-02-03 DIAGNOSIS — F419 Anxiety disorder, unspecified: Secondary | ICD-10-CM | POA: Diagnosis present

## 2016-02-03 DIAGNOSIS — R0602 Shortness of breath: Secondary | ICD-10-CM | POA: Diagnosis present

## 2016-02-03 DIAGNOSIS — I82402 Acute embolism and thrombosis of unspecified deep veins of left lower extremity: Secondary | ICD-10-CM | POA: Diagnosis present

## 2016-02-03 DIAGNOSIS — I82432 Acute embolism and thrombosis of left popliteal vein: Secondary | ICD-10-CM | POA: Diagnosis not present

## 2016-02-03 DIAGNOSIS — I2699 Other pulmonary embolism without acute cor pulmonale: Secondary | ICD-10-CM

## 2016-02-03 DIAGNOSIS — I82409 Acute embolism and thrombosis of unspecified deep veins of unspecified lower extremity: Secondary | ICD-10-CM

## 2016-02-03 HISTORY — DX: Other pulmonary embolism without acute cor pulmonale: I26.99

## 2016-02-03 HISTORY — DX: Acute embolism and thrombosis of unspecified deep veins of unspecified lower extremity: I82.409

## 2016-02-03 HISTORY — DX: Low back pain: M54.5

## 2016-02-03 HISTORY — DX: Low back pain, unspecified: M54.50

## 2016-02-03 HISTORY — DX: Other chronic pain: G89.29

## 2016-02-03 HISTORY — DX: Cramp and spasm: R25.2

## 2016-02-03 HISTORY — DX: Other intervertebral disc displacement, lumbar region: M51.26

## 2016-02-03 LAB — CBC WITH DIFFERENTIAL/PLATELET
BASOS ABS: 0 10*3/uL (ref 0.0–0.1)
BASOS PCT: 0 %
EOS ABS: 0.1 10*3/uL (ref 0.0–0.7)
EOS PCT: 1 %
HCT: 40 % (ref 36.0–46.0)
HEMOGLOBIN: 13.8 g/dL (ref 12.0–15.0)
Lymphocytes Relative: 16 %
Lymphs Abs: 1.6 10*3/uL (ref 0.7–4.0)
MCH: 32.8 pg (ref 26.0–34.0)
MCHC: 34.5 g/dL (ref 30.0–36.0)
MCV: 95 fL (ref 78.0–100.0)
MONO ABS: 0.7 10*3/uL (ref 0.1–1.0)
Monocytes Relative: 6 %
Neutro Abs: 7.7 10*3/uL (ref 1.7–7.7)
Neutrophils Relative %: 77 %
Platelets: 203 10*3/uL (ref 150–400)
RBC: 4.21 MIL/uL (ref 3.87–5.11)
RDW: 11.9 % (ref 11.5–15.5)
WBC: 10.1 10*3/uL (ref 4.0–10.5)

## 2016-02-03 LAB — BASIC METABOLIC PANEL
Anion gap: 10 (ref 5–15)
BUN: 13 mg/dL (ref 6–20)
CALCIUM: 9.2 mg/dL (ref 8.9–10.3)
CHLORIDE: 106 mmol/L (ref 101–111)
CO2: 22 mmol/L (ref 22–32)
CREATININE: 0.72 mg/dL (ref 0.44–1.00)
GFR calc non Af Amer: >60 mL/min (ref >60)
GLUCOSE: 97 mg/dL (ref 65–99)
Potassium: 3.8 mmol/L (ref 3.5–5.1)
Sodium: 138 mmol/L (ref 135–145)

## 2016-02-03 LAB — APTT: aPTT: 27 seconds (ref 24–37)

## 2016-02-03 LAB — HEPARIN LEVEL (UNFRACTIONATED)
HEPARIN UNFRACTIONATED: 0.43 [IU]/mL (ref 0.30–0.70)
Heparin Unfractionated: 0.11 IU/mL — ABNORMAL LOW (ref 0.30–0.70)

## 2016-02-03 LAB — TROPONIN I

## 2016-02-03 LAB — PREGNANCY, URINE: Preg Test, Ur: NEGATIVE

## 2016-02-03 LAB — MRSA PCR SCREENING: MRSA by PCR: NEGATIVE

## 2016-02-03 MED ORDER — ADULT MULTIVITAMIN W/MINERALS CH
1.0000 | ORAL_TABLET | Freq: Every day | ORAL | Status: DC
  Administered 2016-02-04 – 2016-02-06 (×3): 1 via ORAL
  Filled 2016-02-03 (×3): qty 1

## 2016-02-03 MED ORDER — HEPARIN BOLUS VIA INFUSION
5000.0000 [IU] | Freq: Once | INTRAVENOUS | Status: AC
  Administered 2016-02-03: 5000 [IU] via INTRAVENOUS

## 2016-02-03 MED ORDER — LORATADINE 10 MG PO TABS
10.0000 mg | ORAL_TABLET | Freq: Every day | ORAL | Status: DC
  Administered 2016-02-04 – 2016-02-06 (×3): 10 mg via ORAL
  Filled 2016-02-03 (×3): qty 1

## 2016-02-03 MED ORDER — IOPAMIDOL (ISOVUE-370) INJECTION 76%
100.0000 mL | Freq: Once | INTRAVENOUS | Status: AC | PRN
  Administered 2016-02-03: 100 mL via INTRAVENOUS

## 2016-02-03 MED ORDER — HEPARIN (PORCINE) IN NACL 100-0.45 UNIT/ML-% IJ SOLN
1400.0000 [IU]/h | INTRAMUSCULAR | Status: DC
  Administered 2016-02-03 – 2016-02-04 (×3): 1400 [IU]/h via INTRAVENOUS
  Filled 2016-02-03 (×3): qty 250

## 2016-02-03 MED ORDER — FENTANYL CITRATE (PF) 100 MCG/2ML IJ SOLN
50.0000 ug | Freq: Once | INTRAMUSCULAR | Status: AC
  Administered 2016-02-03: 50 ug via INTRAVENOUS
  Filled 2016-02-03: qty 2

## 2016-02-03 NOTE — ED Provider Notes (Signed)
CSN: [id number]     Arrival date & time 02/03/16  1313 History   First MD Initiated Contact with Patient 02/03/16 1323     Chief Complaint  Patient presents with  . Leg Pain  . Shortness of Breath     (Consider location/radiation/quality/duration/timing/severity/associated sxs/prior Treatment) HPI Comments: Patient presents to the emergency department with chief complaint of shortness of breath. She reports associated sharp chest pain, and left leg pain and swelling. She states that her symptoms started with left leg swelling, pain 1 week ago. She reports pain to touch along her calf muscle. She states that 2 days ago she began having shortness of breath, which she has never had before. She also reports sharp and intermittent chest pain. Additionally, patient states that she had a dermoid cyst removed 2 months ago. She takes oral birth control. 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 She states that she has not been as active as she normally is.  She denies prior hx of DVT, PE, or ACS.  The history is provided by the patient. No language interpreter was used.    Past Medical History  Diagnosis Date  . Anxiety   . Anemia     history of   Past Surgical History  Procedure Laterality Date  . No past surgeries    . Laparotomy Right 11/24/2015    Procedure: LAPAROTOMY RIGHT OVARIAN CYSTECTOMY;  Surgeon: Marylynn Pearson, MD;  Location: [facility];  Service: Gynecology;  Laterality: Right;   Family History  Problem Relation Age of Onset  . Atrial fibrillation Father   . Atrial fibrillation Paternal Grandmother    Social History  Substance Use Topics  . Smoking status: Never Smoker   . Smokeless tobacco: Never Used  . Alcohol Use: 0.0 oz/week    0 Standard drinks or equivalent per week   OB History    No data available     Review of Systems  Constitutional: Negative for fever and chills.  Respiratory: Positive for  shortness of breath.   Cardiovascular: Positive for chest pain.  Gastrointestinal: Negative for nausea, vomiting, diarrhea and constipation.  Genitourinary: Negative for dysuria.  Musculoskeletal:       Left calf swelling  All other systems reviewed and are negative.     Allergies  Review of patient's allergies indicates no known allergies.  Home Medications   Prior to Admission medications   Medication Sig Start Date End Date Taking? Authorizing Provider  ALPRAZolam (XANAX PO) Take by mouth.   Yes Historical Provider, MD  cetirizine (ZYRTEC) 10 MG tablet Take 10 mg by mouth daily.    Historical Provider, MD  citalopram (CELEXA) 20 MG tablet Take 20 mg by mouth daily.    Historical Provider, MD  ibuprofen (ADVIL,MOTRIN) 600 MG tablet Take 1 tablet (600 mg total) by mouth every 6 (six) hours as needed for moderate pain. 11/26/15   Marylynn Pearson, MD  Multiple Vitamin (MULTIVITAMIN WITH MINERALS) TABS tablet Take 1 tablet by mouth daily.    Historical Provider, MD  Multiple Vitamins-Minerals (HAIR/SKIN/NAILS PO) Take 1 tablet by mouth daily.    Historical Provider, MD  Norethindrone-Ethinyl Estradiol-Fe Biphas (LO LOESTRIN FE) 1 MG-10 MCG / 10 MCG tablet Take 1 tablet by mouth daily.    Historical Provider, MD  oxyCODONE-acetaminophen (PERCOCET/ROXICET) 5-325 MG tablet Take 1-2 tablets by mouth every 4 (four) hours as needed for severe pain (moderate to severe pain (  when tolerating fluids)). 11/26/15   Marylynn Pearson, MD  Probiotic Product (PROBIOTIC PO) Take 1 tablet by mouth 2 (two) times daily.    Historical Provider, MD   BP 125/95 mmHg  Pulse 93  Temp(Src) 97.7 F (36.5 C) (Oral)  Resp 18  Ht 5\' 9"  (1.753 m)  Wt 90.719 kg  BMI 29.52 kg/m2  SpO2 98%  LMP 01/26/2016 Physical Exam  Constitutional: She is oriented to person, place, and time. She appears well-developed and well-nourished.  HENT:  Head: Normocephalic and atraumatic.  Eyes: Conjunctivae and EOM are normal.  Pupils are equal, round, and reactive to light.  Neck: Normal range of motion. Neck supple.  Cardiovascular: Normal rate and regular rhythm.  Exam reveals no gallop and no friction rub.   No murmur heard. Pulmonary/Chest: Effort normal and breath sounds normal. No respiratory distress. She has no wheezes. She has no rales. She exhibits no tenderness.  Sounds out of breath when speaking CTAB Normal RR  Abdominal: Soft. Bowel sounds are normal. She exhibits no distension and no mass. There is no tenderness. There is no rebound and no guarding.  Musculoskeletal: Normal range of motion. She exhibits no edema or tenderness.  Mild left calf swelling and tenderness   Neurological: She is alert and oriented to person, place, and time.  Skin: Skin is warm and dry.  Psychiatric: She has a normal mood and affect. Her behavior is normal. Judgment and thought content normal.  Nursing note and vitals reviewed.   ED Course  Procedures (including critical care time)  Results for orders placed or performed during the hospital encounter of 02/03/16  CBC with Differential/Platelet  Result Value Ref Range   WBC 10.1 4.0 - 10.5 K/uL   RBC 4.21 3.87 - 5.11 MIL/uL   Hemoglobin 13.8 12.0 - 15.0 g/dL   HCT 40.0 36.0 - 46.0 %   MCV 95.0 78.0 - 100.0 fL   MCH 32.8 26.0 - 34.0 pg   MCHC 34.5 30.0 - 36.0 g/dL   RDW 11.9 11.5 - 15.5 %   Platelets 203 150 - 400 K/uL   Neutrophils Relative % 77 %   Neutro Abs 7.7 1.7 - 7.7 K/uL   Lymphocytes Relative 16 %   Lymphs Abs 1.6 0.7 - 4.0 K/uL   Monocytes Relative 6 %   Monocytes Absolute 0.7 0.1 - 1.0 K/uL   Eosinophils Relative 1 %   Eosinophils Absolute 0.1 0.0 - 0.7 K/uL   Basophils Relative 0 %   Basophils Absolute 0.0 0.0 - 0.1 K/uL  Basic metabolic panel  Result Value Ref Range   Sodium 138 135 - 145 mmol/L   Potassium 3.8 3.5 - 5.1 mmol/L   Chloride 106 101 - 111 mmol/L   CO2 22 22 - 32 mmol/L   Glucose, Bld 97 65 - 99 mg/dL   BUN 13 6 - 20 mg/dL    Creatinine, Ser 0.72 0.44 - 1.00 mg/dL   Calcium 9.2 8.9 - 10.3 mg/dL   GFR calc non Af Amer >60 >60 mL/min   GFR calc Af Amer >60 >60 mL/min   Anion gap 10 5 - 15  Pregnancy, urine  Result Value Ref Range   Preg Test, Ur NEGATIVE NEGATIVE  Troponin I  Result Value Ref Range   Troponin I <0.03 <0.031 ng/mL   Ct Angio Chest Pe W/cm &/or Wo Cm  02/03/2016  CLINICAL DATA:  Two day history of shortness of breath.  Anemia. EXAM: CT ANGIOGRAPHY CHEST WITH CONTRAST  TECHNIQUE: Multidetector CT imaging of the chest was performed using the standard protocol during bolus administration of intravenous contrast. Multiplanar CT image reconstructions and MIPs were obtained to evaluate the vascular anatomy. CONTRAST:  100 mL Isovue 370 nonionic COMPARISON:  None. FINDINGS: Mediastinum/Lymph Nodes: There is extensive pulmonary embolism arising from the right mid main pulmonary artery with extension into multiple right-sided upper and lower lobe pulmonary arterial branches. There are multiple left lower lobe segmental pulmonary emboli as well as a pulmonary embolus in the posterior segment left upper lobe pulmonary artery branch. No pulmonary embolus is seen in the left main pulmonary artery. The right ventricle to left ventricle diameter ratio is 1.0, abnormal and consistent with a degree of right heart strain. There is no thoracic aortic aneurysm or dissection. Visualized great vessels appear normal. Right and left common carotid arteries arise as a common trunk, an anatomic variant. Pericardium is not thickened. Visualized thyroid appears normal. There is no demonstrable adenopathy. Lungs/Pleura: Lungs are clear except for occasional small bullae in the lower lobes. No edema or consolidation. No demonstrable pulmonary infarct. No pleural effusion. Upper abdomen: Visualized upper abdominal structures appear unremarkable. Musculoskeletal: No blastic or lytic bone lesions. Review of the MIP images confirms the above  findings. IMPRESSION: Extensive pulmonary embolism bilaterally, more pronounced on the right than on the left. Evidence of right heart strain. Positive for acute PE with CT evidence of right heart strain (RV/LV Ratio = 1.0) consistent with at least submassive (intermediate risk) PE. The presence of right heart strain has been associated with an increased risk of morbidity and mortality. Please activate Code PE by paging [phone]. No parenchymal lung edema or consolidation.  No adenopathy. Critical Value/emergent results were called by telephone at the time of interpretation on 02/03/2016 at 2:44 pm to Dr. Carleene Overlie, who verbally acknowledged these results. Electronically Signed   By: Lowella Grip III M.D.   On: 02/03/2016 14:46     I have personally reviewed and evaluated these images and lab results as part of my medical decision-making.   EKG Interpretation   Date/Time:  Thursday Feb 03 2016 13:54:26 EDT Ventricular Rate:  93 PR Interval:  135 QRS Duration: 94 QT Interval:  387 QTC Calculation: 481 R Axis:   80 Text Interpretation:  Sinus rhythm Borderline Q waves in lateral leads  Minimal ST depression, inferior leads Borderline prolonged QT interval No  previous ECGs available Confirmed by Wyvonnia Dusky  MD, STEPHEN [phone]) on  02/03/2016 2:00:22 PM Also confirmed by Wyvonnia Dusky  MD, STEPHEN [phone]), editor  Gilford Rile, CCT, [place] ([id number])  on 02/03/2016 2:37:24 PM      MDM   Final diagnoses:  PE (pulmonary embolism)  DVT (deep venous thrombosis), left    Patient with left calf swelling and pain.  Swelling went down yesterday, but pain persists.  She has had new SOB and chest pain for the past two days.  Sounds out of breath when speaking.  CTAB.  Non-smoker, no hx of asthma.  She takes oral birth control and has been sitting at her desk for 12 hours a day for the past couple of weeks.  Given constellation of symptoms, pre-test probability for PE is moderate to high.  Will forego  d-dimer.  Discussed with Dr. Wyvonnia Dusky, who agrees with this plan.  Will get CT angio chest and DVT study.    CT scan positive for PE as above, DVT study also positive. Patient started on heparin. Code PE activated. Discussed patient with Dr. Titus Mould  of critical care at [facility], who will consult, but recommends admission to hospitalist service. Patient's heart rate is less than 100, respiration rates are normal, O2 sats ration is 98-99%. Troponin is negative.  3:27 PM Appreciate consultation with Dr. Waldron Labs, who will accept in transfer.  Admit to stepdown at [facility].  Filed Vitals:   02/03/16 1320 02/03/16 1501  BP: 125/95 128/91  Pulse: 93 96  Temp: 97.7 F (36.5 C)   Resp: 18 18   O2 sat 99%   Medications  heparin ADULT infusion 100 units/mL (25000 units/250 mL) (1,400 Units/hr Intravenous New Bag/Given 02/03/16 1518)  iopamidol (ISOVUE-370) 76 % injection 100 mL (100 mLs Intravenous Contrast Given 02/03/16 1418)  heparin bolus via infusion 5,000 Units (5,000 Units Intravenous Given 02/03/16 1518)    CRITICAL CARE Performed by: Montine Circle   Total critical care time: 35 minutes  Critical care time was exclusive of separately billable procedures and treating other patients.  Critical care was necessary to treat or prevent imminent or life-threatening deterioration.  Critical care was time spent personally by me on the following activities: development of treatment plan with patient and/or surrogate as well as nursing, discussions with consultants, evaluation of patient's response to treatment, examination of patient, obtaining history from patient or surrogate, ordering and performing treatments and interventions, ordering and review of laboratory studies, ordering and review of radiographic studies, pulse oximetry and re-evaluation of patient's condition.    Montine Circle, PA-C 02/03/16 [place], MD 02/03/16 [phone]

## 2016-02-03 NOTE — ED Notes (Signed)
[facility] here at this time 

## 2016-02-03 NOTE — ED Notes (Signed)
Report given to Kelly, RN.

## 2016-02-03 NOTE — Progress Notes (Signed)
Patient presents with left lower extremity edema, and pleuritic chest pain, workup significant for submassive bilateral PE, started on heparin drip, ED physician discussed with Dr. Titus Mould, PCCM will see one patient gets to [facility], accepted to step down bed, she is on contraception, as well recent dermoid cyst removal surgery in left pelvis, and reports long working hours sitting 12-14 hours per day, vital signs stable per ED physicain. Phillips Climes MD

## 2016-02-03 NOTE — ED Notes (Signed)
Pt sts having dermoid cyst removed 2 months ago.

## 2016-02-03 NOTE — Progress Notes (Signed)
ANTICOAGULATION CONSULT NOTE - Initial Consult  Pharmacy Consult for heparin Indication: pulmonary embolus  No Known Allergies  Patient Measurements: Height: 5\' 9"  (175.3 cm) Weight: 208 lb 9.6 oz (94.62 kg) IBW/kg (Calculated) : 66.2 Heparin Dosing Weight: 85 kg  Vital Signs: Temp: 97.7 F (36.5 C) (05/04 1320) Temp Source: Oral (05/04 1320) BP: 125/95 mmHg (05/04 1320) Pulse Rate: 93 (05/04 1320)  Labs:  Recent Labs  02/03/16 1345  HGB 13.8  HCT 40.0  PLT 203  CREATININE 0.72  TROPONINI <0.03    Estimated Creatinine Clearance: 126 mL/min (by C-G formula based on Cr of 0.72).  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  Levester Fresh, PharmD, BCPS, [facility] Clinical Pharmacist Pager [phone] 02/03/2016 3:04 PM

## 2016-02-03 NOTE — Progress Notes (Signed)
ANTICOAGULATION CONSULT NOTE - Follow Up Consult  Pharmacy Consult for Heparin Indication: pulmonary embolus  No Known Allergies  Patient Measurements: Height: 5\' 9"  (175.3 cm) Weight: 208 lb 9.6 oz (94.62 kg) IBW/kg (Calculated) : 66.2 Heparin Dosing Weight: 85 kg  Vital Signs: Temp: 99.8 F (37.7 C) (05/04 2009) Temp Source: Oral (05/04 2009) BP: 125/77 mmHg (05/04 2009) Pulse Rate: 86 (05/04 2009)  Labs:  Recent Labs  02/03/16 1345 02/03/16 1545 02/03/16 2137  HGB 13.8  --   --   HCT 40.0  --   --   PLT 203  --   --   APTT 27  --   --   HEPARINUNFRC  --  0.11* 0.43  CREATININE 0.72  --   --   TROPONINI <0.03  --   --     Estimated Creatinine Clearance: 126 mL/min (by C-G formula based on Cr of 0.72).   Medications:  Infusions:  . heparin 1,400 Units/hr (02/03/16 1518)   Assessment: 29 yof on IV heparin for PE. Heparin level is therapeutic at 1400 units/hr. Currently satting well on room air. No bleeding reported.   Goal of Therapy:  Heparin level 0.3-0.7 units/ml Monitor platelets by anticoagulation protocol: Yes   Plan:  Continue heparin at 1400 units/hr. Recheck heparin level in 6 hours -ok with AM labs.  Daily heparin level and CBC while on therapy.   Sloan Leiter, PharmD, BCPS Clinical Pharmacist [phone] 02/03/2016,10:20 PM

## 2016-02-03 NOTE — Consult Note (Signed)
Name: Gail Foster MRN: [id number] DOB: 23-Jul-1986    ADMISSION DATE:  02/03/2016 CONSULTATION DATE:  02/03/16  REFERRING MD :  TRH  CHIEF COMPLAINT:  SOB   HISTORY OF PRESENT ILLNESS:  Gail Foster is a 30 y.o. female with a PMH of anxiety and iron deficiency anemia.  She was in her USOH up until about 1 week ago when she began to have LLE edema and discomfort.  She put it off initially but then later began to experience SOB.  SOB began roughly 1 week ago and was associated with pleuritic chest pain.  She feels that she is unable to really take a deep breath in.   She presented to [facility] for further evaluation and CTA there revealed acute bilateral PE, R > L.  She was started on heparin gtt and transferred to [facility] for further evaluation.  PCCM was consulted for consideration of catheter directed lysis.  She denies any personal or family hx of VTE, personal or family hx of malignancy, tobacco use, recent long trips, hemoptysis.  She did have GYN surgery in February and was bed bound for a week or so thereafter.  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  She does take oral contraceptive.  She currently denies any chest pain at rest, only has some pain with deep breaths.  Breathing is comfortable at rest on room air and vitals are stable.  PAST MEDICAL HISTORY :   has a past medical history of Anxiety and Anemia.  has past surgical history that includes No past surgeries and laparotomy (Right, 11/24/2015). Prior to Admission medications   Medication Sig Start Date End Date Taking? Authorizing Provider  ALPRAZolam (XANAX PO) Take by mouth.   Yes Historical Provider, MD  cetirizine (ZYRTEC) 10 MG tablet Take 10 mg by mouth daily.    Historical Provider, MD  ibuprofen (ADVIL,MOTRIN) 600 MG tablet Take 1 tablet (600 mg total) by mouth every 6 (six) hours as needed for moderate pain. 11/26/15   Marylynn Pearson, MD  Multiple Vitamin (MULTIVITAMIN WITH MINERALS) TABS tablet Take 1 tablet  by mouth daily.    Historical Provider, MD  Multiple Vitamins-Minerals (HAIR/SKIN/NAILS PO) Take 1 tablet by mouth daily.    Historical Provider, MD  Norethindrone-Ethinyl Estradiol-Fe Biphas (LO LOESTRIN FE) 1 MG-10 MCG / 10 MCG tablet Take 1 tablet by mouth daily.    Historical Provider, MD  Probiotic Product (PROBIOTIC PO) Take 1 tablet by mouth 2 (two) times daily.    Historical Provider, MD   No Known Allergies  FAMILY HISTORY:  family history includes Atrial fibrillation in her father and paternal grandmother. SOCIAL HISTORY:  reports that she has never smoked. She has never used smokeless tobacco. She reports that she drinks alcohol. She reports that she does not use illicit drugs.  REVIEW OF SYSTEMS:   All negative; except for those that are bolded, which indicate positives.  Constitutional: weight loss, weight gain, night sweats, fevers, chills, fatigue, weakness.  HEENT: headaches, sore throat, sneezing, nasal congestion, post nasal drip, difficulty swallowing, tooth/dental problems, visual complaints, visual changes, ear aches. Neuro: difficulty with speech, weakness, numbness, ataxia. CV:  chest pain, orthopnea, PND, swelling and pain in LLE, dizziness, palpitations, syncope. Resp: cough, hemoptysis, dyspnea, wheezing. GI  heartburn, indigestion, abdominal pain, nausea, vomiting, diarrhea, constipation, change in bowel habits, loss of appetite, hematemesis, melena, hematochezia.  GU: dysuria, change in color of urine, urgency or frequency, flank pain, hematuria. MSK: joint pain,  decreased range of motion. Psych: change in mood or affect, depression, anxiety, suicidal ideations, homicidal ideations. Skin: rash, itching, bruising.    SUBJECTIVE: Breathing is comfortable on room air and vitals stable.  Denies chest pain.  VITAL SIGNS: Temp:  [97.7 F (36.5 C)-99.8 F (37.7 C)] 99.8 F (37.7 C) (05/04 2009) Pulse Rate:  [86-98] 86 (05/04 2009) Resp:  [18-25] 20 (05/04  2009) BP: (99-128)/(74-95) 125/77 mmHg (05/04 2009) SpO2:  [98 %-100 %] 98 % (05/04 2009) Weight:  [200 lb (90.719 kg)-208 lb 9.6 oz (94.62 kg)] 208 lb 9.6 oz (94.62 kg) (05/04 1500)  PHYSICAL EXAMINATION: General: Young caucasian female, resting in bed visiting with family, in NAD. Neuro: A&O x 3, non-focal.  HEENT: [place]/AT. PERRL, sclerae anicteric. Cardiovascular: RRR, no M/R/G.  Lungs: Respirations even and unlabored.  CTA bilaterally, No W/R/R.  Abdomen: BS x 4, soft, NT/ND.  Musculoskeletal: No gross deformities, LLE edema > RLE. Skin: Intact, warm, no rashes.     Recent Labs Lab 02/03/16 1345  NA 138  K 3.8  CL 106  CO2 22  BUN 13  CREATININE 0.72  GLUCOSE 97    Recent Labs Lab 02/03/16 1345  HGB 13.8  HCT 40.0  WBC 10.1  PLT 203   Ct Angio Chest Pe W/cm &/or Wo Cm  02/03/2016  CLINICAL DATA:  Two day history of shortness of breath.  Anemia. EXAM: CT ANGIOGRAPHY CHEST WITH CONTRAST TECHNIQUE: Multidetector CT imaging of the chest was performed using the standard protocol during bolus administration of intravenous contrast. Multiplanar CT image reconstructions and MIPs were obtained to evaluate the vascular anatomy. CONTRAST:  100 mL Isovue 370 nonionic COMPARISON:  None. FINDINGS: Mediastinum/Lymph Nodes: There is extensive pulmonary embolism arising from the right mid main pulmonary artery with extension into multiple right-sided upper and lower lobe pulmonary arterial branches. There are multiple left lower lobe segmental pulmonary emboli as well as a pulmonary embolus in the posterior segment left upper lobe pulmonary artery branch. No pulmonary embolus is seen in the left main pulmonary artery. The right ventricle to left ventricle diameter ratio is 1.0, abnormal and consistent with a degree of right heart strain. There is no thoracic aortic aneurysm or dissection. Visualized great vessels appear normal. Right and left common carotid arteries arise as a common trunk, an  anatomic variant. Pericardium is not thickened. Visualized thyroid appears normal. There is no demonstrable adenopathy. Lungs/Pleura: Lungs are clear except for occasional small bullae in the lower lobes. No edema or consolidation. No demonstrable pulmonary infarct. No pleural effusion. Upper abdomen: Visualized upper abdominal structures appear unremarkable. Musculoskeletal: No blastic or lytic bone lesions. Review of the MIP images confirms the above findings. IMPRESSION: Extensive pulmonary embolism bilaterally, more pronounced on the right than on the left. Evidence of right heart strain. Positive for acute PE with CT evidence of right heart strain (RV/LV Ratio = 1.0) consistent with at least submassive (intermediate risk) PE. The presence of right heart strain has been associated with an increased risk of morbidity and mortality. Please activate Code PE by paging [phone]. No parenchymal lung edema or consolidation.  No adenopathy. Critical Value/emergent results were called by telephone at the time of interpretation on 02/03/2016 at 2:44 pm to Dr. Carleene Overlie, who verbally acknowledged these results. Electronically Signed   By: Lowella Grip III M.D.   On: 02/03/2016 14:46   US Venous Img Lower Unilateral Left  02/03/2016  CLINICAL DATA:  Left calf pain for 10 days, initial encounter EXAM:  Left LOWER EXTREMITY VENOUS DOPPLER ULTRASOUND TECHNIQUE: Gray-scale sonography with graded compression, as well as color Doppler and duplex ultrasound were performed to evaluate the lower extremity deep venous systems from the level of the common femoral vein and including the common femoral, femoral, profunda femoral, popliteal and calf veins including the posterior tibial, peroneal and gastrocnemius veins when visible. The superficial great saphenous vein was also interrogated. Spectral Doppler was utilized to evaluate flow at rest and with distal augmentation maneuvers in the common femoral, femoral and  popliteal veins. COMPARISON:  None. FINDINGS: Contralateral Common Femoral Vein: Respiratory phasicity is normal and symmetric with the symptomatic side. No evidence of thrombus. Normal compressibility. Common Femoral Vein: No evidence of thrombus. Normal compressibility, respiratory phasicity and response to augmentation. Saphenofemoral Junction: No evidence of thrombus. Normal compressibility and flow on color Doppler imaging. Profunda Femoral Vein: No evidence of thrombus. Normal compressibility and flow on color Doppler imaging. Femoral Vein: No evidence of thrombus. Normal compressibility, respiratory phasicity and response to augmentation. Popliteal Vein: No evidence of thrombus. Normal compressibility, respiratory phasicity and response to augmentation. Calf Veins: Thrombus is noted within peroneal vein extending into the tibioperoneal trunk Superficial Great Saphenous Vein: No evidence of thrombus. Normal compressibility and flow on color Doppler imaging. Venous Reflux:  None. Other Findings:  None. IMPRESSION: Infrapopliteal DVT Electronically Signed   By: Inez Catalina M.D.   On: 02/03/2016 15:29    STUDIES:  CTA Chest 05/04 > acute bilateral PE, R > L.  CT suggestion of right heart strain (RV / LV = 1.0). LLE venous duplex >  Infrapopliteal DVT.  SIGNIFICANT EVENTS  05/04 > admitted with LLE DVT and bilateral PE.  Started on heparin.  ASSESSMENT / PLAN:  Acute LLE DVT with bilateral PE, R > L - likely provoked at least to some extent.  She did have surgery at the end of February and was somewhat immobile for a few weeks.  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  Otherwise, she is fairly active and participates in cross fit fairly routinely.  In addition, she uses an oral contraceptive.  She is currently comfortable on room air and her vitals are reassuring.  Normal troponin also noted. Plan: Continue heparin gtt. No indication for systemic or catheter directed thrombolysis at this  point. Assess echo; if this shows significant heart strain (doubtful), then please call back and we will consider EKOS. Trend troponins. Agree should likely stop OCP; advise to discuss further options with OB / GYN.  PCCM will sign off.  Please do not hesitate to call us back if we can be of any further assistance.   Montey Hora, [facility] Pager: [phone]  or [phone] 02/03/2016, 9:45 PM

## 2016-02-03 NOTE — ED Notes (Signed)
Left leg pain for a week. Sob started 2 days ago.

## 2016-02-03 NOTE — H&P (Signed)
History and Physical    Gail Foster [id number] DOB: Aug 10, 1986 DOA: 02/03/2016  Referring MD/NP/PA: Dr. Waldron Labs PCP: No PCP Per Patient Outpatient Specialists: None Patient coming from: [facility]  Chief Complaint: SOB  HPI: Gail Foster is a 30 y.o. female with medical history significant of previously healthy.  Patient presents to the ED at [facility] with c/o LLE edema, pleuritic chest pain, SOB.  Symptoms in her LLE have been ongoing and worsening for the past 1 week.  SOB has been worsening for the past 2 days with intermittent pleuritic chest pain.  Patient presents to ED for work up.  ED Course: CTA is positive for submassive PE.  [facility] g[facility].  Review of Systems: As per HPI otherwise 10 point review of systems negative.    Past Medical History  Diagnosis Date  . Anxiety   . Anemia     history of    Past Surgical History  Procedure Laterality Date  . No past surgeries    . Laparotomy Right 11/24/2015    Procedure: LAPAROTOMY RIGHT OVARIAN CYSTECTOMY;  Surgeon: Gail Pearson, MD;  Location: [facility];  Service: Gynecology;  Laterality: Right;     reports that she has never smoked. She has never used smokeless tobacco. She reports that she drinks alcohol. She reports that she does not use illicit drugs.  No Known Allergies  Family History  Problem Relation Age of Onset  . Atrial fibrillation Father   . Atrial fibrillation Paternal Grandmother      Prior to Admission medications   Medication Sig Start Date End Date Taking? Authorizing Provider  ALPRAZolam (XANAX PO) Take by mouth.   Yes Historical Provider, MD  cetirizine (ZYRTEC) 10 MG tablet Take 10 mg by mouth daily.    Historical Provider, MD  ibuprofen (ADVIL,MOTRIN) 600 MG tablet Take 1 tablet (600 mg total) by mouth every 6 (six) hours as needed for moderate pain. 11/26/15   Gail Pearson, MD  Multiple Vitamin (MULTIVITAMIN WITH MINERALS) TABS tablet Take 1 tablet by mouth daily.     Historical Provider, MD  Multiple Vitamins-Minerals (HAIR/SKIN/NAILS PO) Take 1 tablet by mouth daily.    Historical Provider, MD  Norethindrone-Ethinyl Estradiol-Fe Biphas (LO LOESTRIN FE) 1 MG-10 MCG / 10 MCG tablet Take 1 tablet by mouth daily.    Historical Provider, MD  Probiotic Product (PROBIOTIC PO) Take 1 tablet by mouth 2 (two) times daily.    Historical Provider, MD    Physical Exam: Filed Vitals:   02/03/16 1630 02/03/16 1808 02/03/16 1851 02/03/16 2009  BP: 99/74 117/92 117/92 125/77  Pulse: 95 93 98 86  Temp:   97.8 F (36.6 C) 99.8 F (37.7 C)  TempSrc:    Oral  Resp: 24 22 24 20   Height:      Weight:      SpO2: 100% 100% 98% 98%      Constitutional: NAD, calm, comfortable Filed Vitals:   02/03/16 1630 02/03/16 1808 02/03/16 1851 02/03/16 2009  BP: 99/74 117/92 117/92 125/77  Pulse: 95 93 98 86  Temp:   97.8 F (36.6 C) 99.8 F (37.7 C)  TempSrc:    Oral  Resp: 24 22 24 20   Height:      Weight:      SpO2: 100% 100% 98% 98%   Eyes: PERRL, lids and conjunctivae normal ENMT: Mucous membranes are moist. Posterior pharynx clear of any exudate or lesions.Normal dentition.  Neck: normal, supple, no masses, no thyromegaly  Respiratory: clear to auscultation bilaterally, no wheezing, no crackles. Normal respiratory effort. No accessory muscle use.  Cardiovascular: Regular rate and rhythm, no murmurs / rubs / gallops. No extremity edema. 2+ pedal pulses. No carotid bruits.  Abdomen: no tenderness, no masses palpated. No hepatosplenomegaly. Bowel sounds positive.  Musculoskeletal: no clubbing / cyanosis. No joint deformity upper and lower extremities. Good ROM, no contractures. Normal muscle tone.  Skin: no rashes, lesions, ulcers. No induration Neurologic: CN 2-12 grossly intact. Sensation intact, DTR normal. Strength 5/5 in all 4.  Psychiatric: Normal judgment and insight. Alert and oriented x 3. Normal mood.    Labs on Admission: I have personally reviewed  following labs and imaging studies  CBC:  Recent Labs Lab 02/03/16 1345  WBC 10.1  NEUTROABS 7.7  HGB 13.8  HCT 40.0  MCV 95.0  PLT 123456   Basic Metabolic Panel:  Recent Labs Lab 02/03/16 1345  NA 138  K 3.8  CL 106  CO2 22  GLUCOSE 97  BUN 13  CREATININE 0.72  CALCIUM 9.2   GFR: Estimated Creatinine Clearance: 126 mL/min (by C-G formula based on Cr of 0.72). Liver Function Tests: No results for input(s): AST, ALT, ALKPHOS, BILITOT, PROT, ALBUMIN in the last 168 hours. No results for input(s): LIPASE, AMYLASE in the last 168 hours. No results for input(s): AMMONIA in the last 168 hours. Coagulation Profile: No results for input(s): INR, PROTIME in the last 168 hours. Cardiac Enzymes:  Recent Labs Lab 02/03/16 1345  TROPONINI <0.03   BNP (last 3 results) No results for input(s): PROBNP in the last 8760 hours. HbA1C: No results for input(s): HGBA1C in the last 72 hours. CBG: No results for input(s): GLUCAP in the last 168 hours. Lipid Profile: No results for input(s): CHOL, HDL, LDLCALC, TRIG, CHOLHDL, LDLDIRECT in the last 72 hours. Thyroid Function Tests: No results for input(s): TSH, T4TOTAL, FREET4, T3FREE, THYROIDAB in the last 72 hours. Anemia Panel: No results for input(s): VITAMINB12, FOLATE, FERRITIN, TIBC, IRON, RETICCTPCT in the last 72 hours. Urine analysis:    Component Value Date/Time   COLORURINE YELLOW 03/17/2012 1007   APPEARANCEUR CLOUDY* 03/17/2012 1007   LABSPEC 1.024 03/19/2012 1121   PHURINE 6.0 03/19/2012 1121   GLUCOSEU NEGATIVE 03/19/2012 1121   HGBUR NEGATIVE 03/19/2012 1121   BILIRUBINUR SMALL* 03/19/2012 1121   KETONESUR 40* 03/19/2012 1121   PROTEINUR 30* 03/19/2012 1121   UROBILINOGEN 0.2 03/19/2012 1121   NITRITE POSITIVE* 03/19/2012 1121   LEUKOCYTESUR SMALL* 03/19/2012 1121   Sepsis Labs: @LABRCNTIP (procalcitonin:4,lacticidven:4) )No results found for this or any previous visit (from the past 240 hour(s)).    Radiological Exams on Admission: Ct Angio Chest Pe W/cm &/or Wo Cm  02/03/2016  CLINICAL DATA:  Two day history of shortness of breath.  Anemia. EXAM: CT ANGIOGRAPHY CHEST WITH CONTRAST TECHNIQUE: Multidetector CT imaging of the chest was performed using the standard protocol during bolus administration of intravenous contrast. Multiplanar CT image reconstructions and MIPs were obtained to evaluate the vascular anatomy. CONTRAST:  100 mL Isovue 370 nonionic COMPARISON:  None. FINDINGS: Mediastinum/Lymph Nodes: There is extensive pulmonary embolism arising from the right mid main pulmonary artery with extension into multiple right-sided upper and lower lobe pulmonary arterial branches. There are multiple left lower lobe segmental pulmonary emboli as well as a pulmonary embolus in the posterior segment left upper lobe pulmonary artery branch. No pulmonary embolus is seen in the left main pulmonary artery. The right ventricle to left ventricle diameter ratio is 1.0,  abnormal and consistent with a degree of right heart strain. There is no thoracic aortic aneurysm or dissection. Visualized great vessels appear normal. Right and left common carotid arteries arise as a common trunk, an anatomic variant. Pericardium is not thickened. Visualized thyroid appears normal. There is no demonstrable adenopathy. Lungs/Pleura: Lungs are clear except for occasional small bullae in the lower lobes. No edema or consolidation. No demonstrable pulmonary infarct. No pleural effusion. Upper abdomen: Visualized upper abdominal structures appear unremarkable. Musculoskeletal: No blastic or lytic bone lesions. Review of the MIP images confirms the above findings. IMPRESSION: Extensive pulmonary embolism bilaterally, more pronounced on the right than on the left. Evidence of right heart strain. Positive for acute PE with CT evidence of right heart strain (RV/LV Ratio = 1.0) consistent with at least submassive (intermediate risk) PE. The  presence of right heart strain has been associated with an increased risk of morbidity and mortality. Please activate Code PE by paging [phone]. No parenchymal lung edema or consolidation.  No adenopathy. Critical Value/emergent results were called by telephone at the time of interpretation on 02/03/2016 at 2:44 pm to Dr. Carleene Overlie, who verbally acknowledged these results. Electronically Signed   By: Lowella Grip III M.D.   On: 02/03/2016 14:46   US Venous Img Lower Unilateral Left  02/03/2016  CLINICAL DATA:  Left calf pain for 10 days, initial encounter EXAM: Left LOWER EXTREMITY VENOUS DOPPLER ULTRASOUND TECHNIQUE: Gray-scale sonography with graded compression, as well as color Doppler and duplex ultrasound were performed to evaluate the lower extremity deep venous systems from the level of the common femoral vein and including the common femoral, femoral, profunda femoral, popliteal and calf veins including the posterior tibial, peroneal and gastrocnemius veins when visible. The superficial great saphenous vein was also interrogated. Spectral Doppler was utilized to evaluate flow at rest and with distal augmentation maneuvers in the common femoral, femoral and popliteal veins. COMPARISON:  None. FINDINGS: Contralateral Common Femoral Vein: Respiratory phasicity is normal and symmetric with the symptomatic side. No evidence of thrombus. Normal compressibility. Common Femoral Vein: No evidence of thrombus. Normal compressibility, respiratory phasicity and response to augmentation. Saphenofemoral Junction: No evidence of thrombus. Normal compressibility and flow on color Doppler imaging. Profunda Femoral Vein: No evidence of thrombus. Normal compressibility and flow on color Doppler imaging. Femoral Vein: No evidence of thrombus. Normal compressibility, respiratory phasicity and response to augmentation. Popliteal Vein: No evidence of thrombus. Normal compressibility, respiratory phasicity and  response to augmentation. Calf Veins: Thrombus is noted within peroneal vein extending into the tibioperoneal trunk Superficial Great Saphenous Vein: No evidence of thrombus. Normal compressibility and flow on color Doppler imaging. Venous Reflux:  None. Other Findings:  None. IMPRESSION: Infrapopliteal DVT Electronically Signed   By: Inez Catalina M.D.   On: 02/03/2016 15:29    EKG: Independently reviewed.  Assessment/Plan Principal Problem:   PE (pulmonary embolism)   PE -  Heparin gtt  2d echo  PCCM eval regarding possible catheter directed TPA  Tele monitor  BLE [place] to look for source DVT  Stopping estrogen containing OCP, discussed this with patient as well.  She will need to follow up with OB/GYN regarding birth control options at this point.   DVT prophylaxis: on full dose anticoagulation for DVT/PE Code Status: Full Family Communication: Family at bedside Consults called: PCCM has seen patient per PE protocol Admission status: Admit to inpatient   Etta Quill DO [facility] Pager [phone] from 7PM-7AM  If 7AM-7PM, please contact the  day physician for the patient [email or web address] Password [facility]  02/03/2016, 9:32 PM

## 2016-02-03 NOTE — ED Notes (Addendum)
Attempted to call report to [id number]. RN unavailable. Made secretary, Butch Penny,  aware that [facility] has left at this time.

## 2016-02-04 ENCOUNTER — Inpatient Hospital Stay (HOSPITAL_COMMUNITY): Payer: BLUE CROSS/BLUE SHIELD

## 2016-02-04 DIAGNOSIS — I82409 Acute embolism and thrombosis of unspecified deep veins of unspecified lower extremity: Secondary | ICD-10-CM

## 2016-02-04 DIAGNOSIS — I2699 Other pulmonary embolism without acute cor pulmonale: Secondary | ICD-10-CM

## 2016-02-04 LAB — BASIC METABOLIC PANEL
ANION GAP: 11 (ref 5–15)
BUN: 9 mg/dL (ref 6–20)
CALCIUM: 9.3 mg/dL (ref 8.9–10.3)
CO2: 23 mmol/L (ref 22–32)
Chloride: 106 mmol/L (ref 101–111)
Creatinine, Ser: 0.78 mg/dL (ref 0.44–1.00)
Glucose, Bld: 97 mg/dL (ref 65–99)
Potassium: 4 mmol/L (ref 3.5–5.1)
Sodium: 140 mmol/L (ref 135–145)

## 2016-02-04 LAB — HEPARIN LEVEL (UNFRACTIONATED): Heparin Unfractionated: 0.36 IU/mL (ref 0.30–0.70)

## 2016-02-04 LAB — ECHOCARDIOGRAM COMPLETE
HEIGHTINCHES: 69 in
Weight: 3337.6 oz

## 2016-02-04 MED ORDER — ACETAMINOPHEN 325 MG PO TABS
650.0000 mg | ORAL_TABLET | Freq: Four times a day (QID) | ORAL | Status: DC | PRN
  Administered 2016-02-04 – 2016-02-06 (×2): 650 mg via ORAL
  Filled 2016-02-04 (×2): qty 2

## 2016-02-04 NOTE — Progress Notes (Signed)
[facility] PROGRESS NOTE  Gail Foster [id number] DOB: 10/25/1985 DOA: 02/03/2016 PCP: No PCP Per Patient  Assessment/Plan: Assessment/Plan Principal Problem:  PE (pulmonary embolism)   PE - Heparin gtt 2d echo today PCCM eval regarding possible catheter directed TPA, advised cont meds and monitoring Tele monitor BLE [place] to look for source DVT Stopped estrogen containing OCP, discussed this with patient as well.  DVT prophylaxis: on full dose anticoagulation for DVT/PE Code Status: Full Family Communication: Family at bedside Consults: PCCM has seen patient per PE protocol Admission status: Admit to inpatient    Procedures:  ECHO pending, RLE doppler venous  Ct Angio Chest Pe W/cm &/or Wo Cm  02/03/2016  CLINICAL DATA:  Two day history of shortness of breath.  Anemia. EXAM: CT ANGIOGRAPHY CHEST WITH CONTRAST TECHNIQUE: Multidetector CT imaging of the chest was performed using the standard protocol during bolus administration of intravenous contrast. Multiplanar CT image reconstructions and MIPs were obtained to evaluate the vascular anatomy. CONTRAST:  100 mL Isovue 370 nonionic COMPARISON:  None. FINDINGS: Mediastinum/Lymph Nodes: There is extensive pulmonary embolism arising from the right mid main pulmonary artery with extension into multiple right-sided upper and lower lobe pulmonary arterial branches. There are multiple left lower lobe segmental pulmonary emboli as well as a pulmonary embolus in the posterior segment left upper lobe pulmonary artery branch. No pulmonary embolus is seen in the left main pulmonary artery. The right ventricle to left ventricle diameter ratio is 1.0, abnormal and consistent with a degree of right heart strain. There is no thoracic aortic aneurysm or dissection. Visualized great vessels appear normal. Right and left common carotid arteries arise as a common trunk, an  anatomic variant. Pericardium is not thickened. Visualized thyroid appears normal. There is no demonstrable adenopathy. Lungs/Pleura: Lungs are clear except for occasional small bullae in the lower lobes. No edema or consolidation. No demonstrable pulmonary infarct. No pleural effusion. Upper abdomen: Visualized upper abdominal structures appear unremarkable. Musculoskeletal: No blastic or lytic bone lesions. Review of the MIP images confirms the above findings. IMPRESSION: Extensive pulmonary embolism bilaterally, more pronounced on the right than on the left. Evidence of right heart strain. Positive for acute PE with CT evidence of right heart strain (RV/LV Ratio = 1.0) consistent with at least submassive (intermediate risk) PE. The presence of right heart strain has been associated with an increased risk of morbidity and mortality. Please activate Code PE by paging [phone]. No parenchymal lung edema or consolidation.  No adenopathy. Critical Value/emergent results were called by telephone at the time of interpretation on 02/03/2016 at 2:44 pm to Dr. Carleene Overlie, who verbally acknowledged these results. Electronically Signed   By: Lowella Grip III M.D.   On: 02/03/2016 14:46   US Venous Img Lower Unilateral Left  02/03/2016  CLINICAL DATA:  Left calf pain for 10 days, initial encounter EXAM: Left LOWER EXTREMITY VENOUS DOPPLER ULTRASOUND TECHNIQUE: Gray-scale sonography with graded compression, as well as color Doppler and duplex ultrasound were performed to evaluate the lower extremity deep venous systems from the level of the common femoral vein and including the common femoral, femoral, profunda femoral, popliteal and calf veins including the posterior tibial, peroneal and gastrocnemius veins when visible. The superficial great saphenous vein was also interrogated. Spectral Doppler was utilized to evaluate flow at rest and with distal augmentation maneuvers in the common femoral, femoral and  popliteal veins. COMPARISON:  None. FINDINGS: Contralateral Common Femoral Vein: Respiratory phasicity is normal and symmetric with the symptomatic side.  No evidence of thrombus. Normal compressibility. Common Femoral Vein: No evidence of thrombus. Normal compressibility, respiratory phasicity and response to augmentation. Saphenofemoral Junction: No evidence of thrombus. Normal compressibility and flow on color Doppler imaging. Profunda Femoral Vein: No evidence of thrombus. Normal compressibility and flow on color Doppler imaging. Femoral Vein: No evidence of thrombus. Normal compressibility, respiratory phasicity and response to augmentation. Popliteal Vein: No evidence of thrombus. Normal compressibility, respiratory phasicity and response to augmentation. Calf Veins: Thrombus is noted within peroneal vein extending into the tibioperoneal trunk Superficial Great Saphenous Vein: No evidence of thrombus. Normal compressibility and flow on color Doppler imaging. Venous Reflux:  None. Other Findings:  None. IMPRESSION: Infrapopliteal DVT Electronically Signed   By: Inez Catalina M.D.   On: 02/03/2016 15:29     Antibiotics:  none (indicate start date, and stop date if known)  HPI/Subjective: Pt doing well this AM. Still SOB but not worse. No hemoptysis. No other bleeding. Denies CP.  Pt lives alone and does not know who could help her at home if d/c.  Objective: Filed Vitals:   02/04/16 0430 02/04/16 0820  BP: 110/70 111/67  Pulse: 80 86  Temp: 98.3 F (36.8 C) 97.7 F (36.5 C)  Resp:  20    Intake/Output Summary (Last 24 hours) at 02/04/16 0858 Last data filed at 02/04/16 0823  Gross per 24 hour  Intake    120 ml  Output   1200 ml  Net  -1080 ml   Filed Weights   02/03/16 1320 02/03/16 1500  Weight: 90.719 kg (200 lb) 94.62 kg (208 lb 9.6 oz)    Exam:   General:  Mild resp distress, well appearing  Cardiovascular: RRR, no MRG  Respiratory: CTAB, slightly incr WOB  Abdomen:  ND, nl BS  Musculoskeletal: moving all extr   Data Reviewed: Basic Metabolic Panel:  Recent Labs Lab 02/03/16 1345 02/04/16 0421  NA 138 140  K 3.8 4.0  CL 106 106  CO2 22 23  GLUCOSE 97 97  BUN 13 9  CREATININE 0.72 0.78  CALCIUM 9.2 9.3   Liver Function Tests: No results for input(s): AST, ALT, ALKPHOS, BILITOT, PROT, ALBUMIN in the last 168 hours. No results for input(s): LIPASE, AMYLASE in the last 168 hours. No results for input(s): AMMONIA in the last 168 hours. CBC:  Recent Labs Lab 02/03/16 1345  WBC 10.1  NEUTROABS 7.7  HGB 13.8  HCT 40.0  MCV 95.0  PLT 203   Cardiac Enzymes:  Recent Labs Lab 02/03/16 1345  TROPONINI <0.03   BNP (last 3 results) No results for input(s): BNP in the last 8760 hours.  ProBNP (last 3 results) No results for input(s): PROBNP in the last 8760 hours.  CBG: No results for input(s): GLUCAP in the last 168 hours.  Recent Results (from the past 240 hour(s))  MRSA PCR Screening     Status: None   Collection Time: 02/03/16  8:05 PM  Result Value Ref Range Status   MRSA by PCR NEGATIVE NEGATIVE Final    Comment:        The GeneXpert MRSA Assay (FDA approved for NASAL specimens only), is one component of a comprehensive MRSA colonization surveillance program. It is not intended to diagnose MRSA infection nor to guide or monitor treatment for MRSA infections.      Studies: Ct Angio Chest Pe W/cm &/or Wo Cm  02/03/2016  CLINICAL DATA:  Two day history of shortness of breath.  Anemia. EXAM: CT ANGIOGRAPHY  CHEST WITH CONTRAST TECHNIQUE: Multidetector CT imaging of the chest was performed using the standard protocol during bolus administration of intravenous contrast. Multiplanar CT image reconstructions and MIPs were obtained to evaluate the vascular anatomy. CONTRAST:  100 mL Isovue 370 nonionic COMPARISON:  None. FINDINGS: Mediastinum/Lymph Nodes: There is extensive pulmonary embolism arising from the right mid main  pulmonary artery with extension into multiple right-sided upper and lower lobe pulmonary arterial branches. There are multiple left lower lobe segmental pulmonary emboli as well as a pulmonary embolus in the posterior segment left upper lobe pulmonary artery branch. No pulmonary embolus is seen in the left main pulmonary artery. The right ventricle to left ventricle diameter ratio is 1.0, abnormal and consistent with a degree of right heart strain. There is no thoracic aortic aneurysm or dissection. Visualized great vessels appear normal. Right and left common carotid arteries arise as a common trunk, an anatomic variant. Pericardium is not thickened. Visualized thyroid appears normal. There is no demonstrable adenopathy. Lungs/Pleura: Lungs are clear except for occasional small bullae in the lower lobes. No edema or consolidation. No demonstrable pulmonary infarct. No pleural effusion. Upper abdomen: Visualized upper abdominal structures appear unremarkable. Musculoskeletal: No blastic or lytic bone lesions. Review of the MIP images confirms the above findings. IMPRESSION: Extensive pulmonary embolism bilaterally, more pronounced on the right than on the left. Evidence of right heart strain. Positive for acute PE with CT evidence of right heart strain (RV/LV Ratio = 1.0) consistent with at least submassive (intermediate risk) PE. The presence of right heart strain has been associated with an increased risk of morbidity and mortality. Please activate Code PE by paging [phone]. No parenchymal lung edema or consolidation.  No adenopathy. Critical Value/emergent results were called by telephone at the time of interpretation on 02/03/2016 at 2:44 pm to Dr. Carleene Overlie, who verbally acknowledged these results. Electronically Signed   By: Lowella Grip III M.D.   On: 02/03/2016 14:46   US Venous Img Lower Unilateral Left  02/03/2016  CLINICAL DATA:  Left calf pain for 10 days, initial encounter EXAM: Left  LOWER EXTREMITY VENOUS DOPPLER ULTRASOUND TECHNIQUE: Gray-scale sonography with graded compression, as well as color Doppler and duplex ultrasound were performed to evaluate the lower extremity deep venous systems from the level of the common femoral vein and including the common femoral, femoral, profunda femoral, popliteal and calf veins including the posterior tibial, peroneal and gastrocnemius veins when visible. The superficial great saphenous vein was also interrogated. Spectral Doppler was utilized to evaluate flow at rest and with distal augmentation maneuvers in the common femoral, femoral and popliteal veins. COMPARISON:  None. FINDINGS: Contralateral Common Femoral Vein: Respiratory phasicity is normal and symmetric with the symptomatic side. No evidence of thrombus. Normal compressibility. Common Femoral Vein: No evidence of thrombus. Normal compressibility, respiratory phasicity and response to augmentation. Saphenofemoral Junction: No evidence of thrombus. Normal compressibility and flow on color Doppler imaging. Profunda Femoral Vein: No evidence of thrombus. Normal compressibility and flow on color Doppler imaging. Femoral Vein: No evidence of thrombus. Normal compressibility, respiratory phasicity and response to augmentation. Popliteal Vein: No evidence of thrombus. Normal compressibility, respiratory phasicity and response to augmentation. Calf Veins: Thrombus is noted within peroneal vein extending into the tibioperoneal trunk Superficial Great Saphenous Vein: No evidence of thrombus. Normal compressibility and flow on color Doppler imaging. Venous Reflux:  None. Other Findings:  None. IMPRESSION: Infrapopliteal DVT Electronically Signed   By: Inez Catalina M.D.   On: 02/03/2016 15:29  Scheduled Meds: . loratadine  10 mg Oral Daily  . multivitamin with minerals  1 tablet Oral Daily   Continuous Infusions: . heparin 1,400 Units/hr (02/04/16 0131)    Principal Problem:   PE (pulmonary  embolism) Active Problems:   DVT (deep venous thrombosis) ([place])    Time spent: [facility] Pager [phone] . If 7PM-7AM, please contact night-coverage at [email or web address], password [facility] 02/04/2016, 8:58 AM  LOS: 1 day

## 2016-02-04 NOTE — Progress Notes (Signed)
*  PRELIMINARY RESULTS* Vascular Ultrasound Right lower extremity venous duplex has been completed.  Preliminary findings: no apparent DVT noted in the RLE.   LLE venous performed at [facility] on 02/03/16 = DVT in the left peroneal veins.    Landry Mellow, RDMS, RVT  02/04/2016, 1:28 PM

## 2016-02-04 NOTE — Care Management Note (Signed)
Case Management Note  Patient Details  Name: Gail Foster MRN: [id number] Date of Birth: 02-14-1986  Subjective/Objective:  Pt admitted for  Pulmonary Embolus.     Action/ Plan: Pt copay for eliquis & xarelto will be $40-Lovenox requires a PA prior to giving copay information [phone]. CM will continue to monitor for medication choice.   Expected Discharge Date:                  Expected Discharge Plan:  Home/Self Care  In-House Referral:     Discharge planning Services  CM Consult, Medication Assistance  Post Acute Care Choice:    Choice offered to:     DME Arranged:    DME Agency:     HH Arranged:    HH Agency:     Status of Service:  In process, will continue to follow  Medicare Important Message Given:    Date Medicare IM Given:    Medicare IM give by:    Date Additional Medicare IM Given:    Additional Medicare Important Message give by:     If discussed at [place] of Stay Meetings, dates discussed:    Additional Comments:  Bethena Roys, RN 02/04/2016, 8:05 PM

## 2016-02-04 NOTE — Progress Notes (Signed)
ANTICOAGULATION CONSULT NOTE - Follow Up Consult  Pharmacy Consult for Heparin Indication: pulmonary embolus  No Known Allergies  Patient Measurements: Height: 5\' 9"  (175.3 cm) Weight: 208 lb 9.6 oz (94.62 kg) IBW/kg (Calculated) : 66.2 Heparin Dosing Weight: 85 kg  Vital Signs: Temp: 97.7 F (36.5 C) (05/05 0820) Temp Source: Oral (05/05 0820) BP: 111/67 mmHg (05/05 0820) Pulse Rate: 86 (05/05 0820)  Labs:  Recent Labs  02/03/16 1345 02/03/16 1545 02/03/16 2137 02/04/16 0421 02/04/16 0932  HGB 13.8  --   --   --   --   HCT 40.0  --   --   --   --   PLT 203  --   --   --   --   APTT 27  --   --   --   --   HEPARINUNFRC  --  0.11* 0.43  --  0.36  CREATININE 0.72  --   --  0.78  --   TROPONINI <0.03  --   --   --   --     Estimated Creatinine Clearance: 126 mL/min (by C-G formula based on Cr of 0.78).   Medications:  Infusions:  . heparin 1,400 Units/hr (02/04/16 0131)   Assessment: 30 yo F presents on 5/4 with SOB. CT showed extensive PE bilaterally with evidence of R heart strain. Now on IV heparin for acute PE. Has been on birth control PTA. Last HL remains therapeutic at 0.36. Plan for ECHO today, if shows significant heart strain may consider EKOS. Hgb and plts stable. No s/s of bleed.  Goal of Therapy:  Heparin level 0.3-0.7 units/ml Monitor platelets by anticoagulation protocol: Yes   Plan:  Continue heparin gtt at 1,400 units/hr Monitor daily HL, CBC, s/s of bleed  F/U Long-term AC Plans/plans for EKOS?  Elenor Quinones, PharmD, BCPS Clinical Pharmacist Pager [phone] 02/04/2016 10:50 AM

## 2016-02-05 LAB — HEPARIN LEVEL (UNFRACTIONATED): Heparin Unfractionated: 0.39 IU/mL (ref 0.30–0.70)

## 2016-02-05 LAB — BASIC METABOLIC PANEL
Anion gap: 10 (ref 5–15)
BUN: 10 mg/dL (ref 6–20)
CALCIUM: 9.2 mg/dL (ref 8.9–10.3)
CHLORIDE: 107 mmol/L (ref 101–111)
CO2: 24 mmol/L (ref 22–32)
CREATININE: 0.74 mg/dL (ref 0.44–1.00)
GFR calc Af Amer: >60 mL/min (ref >60)
GFR calc non Af Amer: >60 mL/min (ref >60)
GLUCOSE: 101 mg/dL — AB (ref 65–99)
Potassium: 4 mmol/L (ref 3.5–5.1)
Sodium: 141 mmol/L (ref 135–145)

## 2016-02-05 LAB — CBC
HEMATOCRIT: 37.4 % (ref 36.0–46.0)
Hemoglobin: 12.8 g/dL (ref 12.0–15.0)
MCH: 33.1 pg (ref 26.0–34.0)
MCHC: 34.2 g/dL (ref 30.0–36.0)
MCV: 96.6 fL (ref 78.0–100.0)
PLATELETS: 214 10*3/uL (ref 150–400)
RBC: 3.87 MIL/uL (ref 3.87–5.11)
RDW: 12.2 % (ref 11.5–15.5)
WBC: 6.6 10*3/uL (ref 4.0–10.5)

## 2016-02-05 MED ORDER — ENOXAPARIN SODIUM 100 MG/ML ~~LOC~~ SOLN: 1.0000 mg/kg | Freq: Two times a day (BID) | SUBCUTANEOUS | Status: DC

## 2016-02-05 MED ORDER — ENOXAPARIN SODIUM 100 MG/ML ~~LOC~~ SOLN
1.0000 mg/kg | Freq: Two times a day (BID) | SUBCUTANEOUS | Status: DC
  Administered 2016-02-05 – 2016-02-06 (×2): 95 mg via SUBCUTANEOUS
  Filled 2016-02-05 (×2): qty 1

## 2016-02-05 NOTE — Progress Notes (Signed)
[facility] PROGRESS NOTE  Gail Foster [id number] DOB: 16-Oct-1985 DOA: 02/03/2016 PCP: No PCP Per Patient  Assessment/Plan: Assessment/Plan Principal Problem:  PE (pulmonary embolism)   PE - Heparin gtt.as patient is not a candidate for invasive procedure. Patient has been transitioned to Lovenox. Discussed options of Coumadin versus other oral medications for anticoagulation for the next 6 to my months. Family patient will discuss his overnight and make a decision by morning. 2d echo today-show no right heart strain PCCM eval regarding possible catheter directed TPA, advised cont meds and monitoring Tele monitor BLE [place] to look for source DVT-right lower extremity clean Stopped estrogen containing OCP, discussed this with patient as well.  DVT prophylaxis: on full dose anticoagulation for DVT/PE Code Status: Full Family Communication: Family at bedside Consults: PCCM has seen patient per PE protocol Admission status: Admit to inpatient    Procedures:  ECHO, left lower extremity Doppler, right lower external Doppler  Ct Angio Chest Pe W/cm &/or Wo Cm  02/03/2016  CLINICAL DATA:  Two day history of shortness of breath.  Anemia. EXAM: CT ANGIOGRAPHY CHEST WITH CONTRAST TECHNIQUE: Multidetector CT imaging of the chest was performed using the standard protocol during bolus administration of intravenous contrast. Multiplanar CT image reconstructions and MIPs were obtained to evaluate the vascular anatomy. CONTRAST:  100 mL Isovue 370 nonionic COMPARISON:  None. FINDINGS: Mediastinum/Lymph Nodes: There is extensive pulmonary embolism arising from the right mid main pulmonary artery with extension into multiple right-sided upper and lower lobe pulmonary arterial branches. There are multiple left lower lobe segmental pulmonary emboli as well as a pulmonary embolus in the posterior segment left upper lobe pulmonary  artery branch. No pulmonary embolus is seen in the left main pulmonary artery. The right ventricle to left ventricle diameter ratio is 1.0, abnormal and consistent with a degree of right heart strain. There is no thoracic aortic aneurysm or dissection. Visualized great vessels appear normal. Right and left common carotid arteries arise as a common trunk, an anatomic variant. Pericardium is not thickened. Visualized thyroid appears normal. There is no demonstrable adenopathy. Lungs/Pleura: Lungs are clear except for occasional small bullae in the lower lobes. No edema or consolidation. No demonstrable pulmonary infarct. No pleural effusion. Upper abdomen: Visualized upper abdominal structures appear unremarkable. Musculoskeletal: No blastic or lytic bone lesions. Review of the MIP images confirms the above findings. IMPRESSION: Extensive pulmonary embolism bilaterally, more pronounced on the right than on the left. Evidence of right heart strain. Positive for acute PE with CT evidence of right heart strain (RV/LV Ratio = 1.0) consistent with at least submassive (intermediate risk) PE. The presence of right heart strain has been associated with an increased risk of morbidity and mortality. Please activate Code PE by paging [phone]. No parenchymal lung edema or consolidation.  No adenopathy. Critical Value/emergent results were called by telephone at the time of interpretation on 02/03/2016 at 2:44 pm to Dr. Carleene Overlie, who verbally acknowledged these results. Electronically Signed   By: Lowella Grip III M.D.   On: 02/03/2016 14:46   US Venous Img Lower Unilateral Left  02/03/2016  CLINICAL DATA:  Left calf pain for 10 days, initial encounter EXAM: Left LOWER EXTREMITY VENOUS DOPPLER ULTRASOUND TECHNIQUE: Gray-scale sonography with graded compression, as well as color Doppler and duplex ultrasound were performed to evaluate the lower extremity deep venous systems from the level of the common femoral vein  and including the common femoral, femoral, profunda femoral, popliteal and calf veins including the posterior tibial,  peroneal and gastrocnemius veins when visible. The superficial great saphenous vein was also interrogated. Spectral Doppler was utilized to evaluate flow at rest and with distal augmentation maneuvers in the common femoral, femoral and popliteal veins. COMPARISON:  None. FINDINGS: Contralateral Common Femoral Vein: Respiratory phasicity is normal and symmetric with the symptomatic side. No evidence of thrombus. Normal compressibility. Common Femoral Vein: No evidence of thrombus. Normal compressibility, respiratory phasicity and response to augmentation. Saphenofemoral Junction: No evidence of thrombus. Normal compressibility and flow on color Doppler imaging. Profunda Femoral Vein: No evidence of thrombus. Normal compressibility and flow on color Doppler imaging. Femoral Vein: No evidence of thrombus. Normal compressibility, respiratory phasicity and response to augmentation. Popliteal Vein: No evidence of thrombus. Normal compressibility, respiratory phasicity and response to augmentation. Calf Veins: Thrombus is noted within peroneal vein extending into the tibioperoneal trunk Superficial Great Saphenous Vein: No evidence of thrombus. Normal compressibility and flow on color Doppler imaging. Venous Reflux:  None. Other Findings:  None. IMPRESSION: Infrapopliteal DVT Electronically Signed   By: Inez Catalina M.D.   On: 02/03/2016 15:29   5/5 Echo Complete Study Conclusions  - Left ventricle: The cavity size was normal. Systolic function was  normal. The estimated ejection fraction was in the range of 55%  to 60%. Images were inadequate for LV wall motion assessment.  Left ventricular diastolic function parameters were normal.  Right lower extremity Doppler final read -pending  Antibiotics:  none (indicate start date, and stop date if known)  HPI/Subjective: Pt doing well this AM.  Still SOB but not worse. No hemoptysis. No other bleeding. Denies CP.  Pt lives alone and does not know who could help her at home if d/c.  Objective: Filed Vitals:   02/04/16 0430 02/04/16 0820  BP: 110/70 111/67  Pulse: 80 86  Temp: 98.3 F (36.8 C) 97.7 F (36.5 C)  Resp:  20    Intake/Output Summary (Last 24 hours) at 02/04/16 0858 Last data filed at 02/04/16 [id number]  Gross per 24 hour  Intake    120 ml  Output   1200 ml  Net  -1080 ml   Filed Weights   02/03/16 1320 02/03/16 1500  Weight: 90.719 kg (200 lb) 94.62 kg (208 lb 9.6 oz)    Exam:   General: No resp distress, well appearing, speaking in full sentences  Cardiovascular: RRR, no MRG  Respiratory: CTAB, normal WOB  Abdomen: ND, nl BS  Musculoskeletal: moving all extr   Data Reviewed: Basic Metabolic Panel:  Recent Labs Lab 02/03/16 1345 02/04/16 0421 02/05/16 0309  NA 138 140 141  K 3.8 4.0 4.0  CL 106 106 107  CO2 22 23 24   GLUCOSE 97 97 101*  BUN 13 9 10   CREATININE 0.72 0.78 0.74  CALCIUM 9.2 9.3 9.2   Liver Function Tests: No results for input(s): AST, ALT, ALKPHOS, BILITOT, PROT, ALBUMIN in the last 168 hours. No results for input(s): LIPASE, AMYLASE in the last 168 hours. No results for input(s): AMMONIA in the last 168 hours. CBC:  Recent Labs Lab 02/03/16 1345 02/05/16 0309  WBC 10.1 6.6  NEUTROABS 7.7  --   HGB 13.8 12.8  HCT 40.0 37.4  MCV 95.0 96.6  PLT 203 214   Cardiac Enzymes:  Recent Labs Lab 02/03/16 1345  TROPONINI <0.03   BNP (last 3 results) No results for input(s): BNP in the last 8760 hours.  ProBNP (last 3 results) No results for input(s): PROBNP in the last 8760 hours.  CBG: No results for input(s): GLUCAP in the last 168 hours.  Recent Results (from the past 240 hour(s))  MRSA PCR Screening     Status: None   Collection Time: 02/03/16  8:05 PM  Result Value Ref Range Status   MRSA by PCR NEGATIVE NEGATIVE Final    Comment:        The  GeneXpert MRSA Assay (FDA approved for NASAL specimens only), is one component of a comprehensive MRSA colonization surveillance program. It is not intended to diagnose MRSA infection nor to guide or monitor treatment for MRSA infections.      Studies: Ct Angio Chest Pe W/cm &/or Wo Cm  02/03/2016  CLINICAL DATA:  Two day history of shortness of breath.  Anemia. EXAM: CT ANGIOGRAPHY CHEST WITH CONTRAST TECHNIQUE: Multidetector CT imaging of the chest was performed using the standard protocol during bolus administration of intravenous contrast. Multiplanar CT image reconstructions and MIPs were obtained to evaluate the vascular anatomy. CONTRAST:  100 mL Isovue 370 nonionic COMPARISON:  None. FINDINGS: Mediastinum/Lymph Nodes: There is extensive pulmonary embolism arising from the right mid main pulmonary artery with extension into multiple right-sided upper and lower lobe pulmonary arterial branches. There are multiple left lower lobe segmental pulmonary emboli as well as a pulmonary embolus in the posterior segment left upper lobe pulmonary artery branch. No pulmonary embolus is seen in the left main pulmonary artery. The right ventricle to left ventricle diameter ratio is 1.0, abnormal and consistent with a degree of right heart strain. There is no thoracic aortic aneurysm or dissection. Visualized great vessels appear normal. Right and left common carotid arteries arise as a common trunk, an anatomic variant. Pericardium is not thickened. Visualized thyroid appears normal. There is no demonstrable adenopathy. Lungs/Pleura: Lungs are clear except for occasional small bullae in the lower lobes. No edema or consolidation. No demonstrable pulmonary infarct. No pleural effusion. Upper abdomen: Visualized upper abdominal structures appear unremarkable. Musculoskeletal: No blastic or lytic bone lesions. Review of the MIP images confirms the above findings. IMPRESSION: Extensive pulmonary embolism  bilaterally, more pronounced on the right than on the left. Evidence of right heart strain. Positive for acute PE with CT evidence of right heart strain (RV/LV Ratio = 1.0) consistent with at least submassive (intermediate risk) PE. The presence of right heart strain has been associated with an increased risk of morbidity and mortality. Please activate Code PE by paging [phone]. No parenchymal lung edema or consolidation.  No adenopathy. Critical Value/emergent results were called by telephone at the time of interpretation on 02/03/2016 at 2:44 pm to Dr. Carleene Overlie, who verbally acknowledged these results. Electronically Signed   By: Lowella Grip III M.D.   On: 02/03/2016 14:46   US Venous Img Lower Unilateral Left  02/03/2016  CLINICAL DATA:  Left calf pain for 10 days, initial encounter EXAM: Left LOWER EXTREMITY VENOUS DOPPLER ULTRASOUND TECHNIQUE: Gray-scale sonography with graded compression, as well as color Doppler and duplex ultrasound were performed to evaluate the lower extremity deep venous systems from the level of the common femoral vein and including the common femoral, femoral, profunda femoral, popliteal and calf veins including the posterior tibial, peroneal and gastrocnemius veins when visible. The superficial great saphenous vein was also interrogated. Spectral Doppler was utilized to evaluate flow at rest and with distal augmentation maneuvers in the common femoral, femoral and popliteal veins. COMPARISON:  None. FINDINGS: Contralateral Common Femoral Vein: Respiratory phasicity is normal and symmetric with the symptomatic side. No evidence of  thrombus. Normal compressibility. Common Femoral Vein: No evidence of thrombus. Normal compressibility, respiratory phasicity and response to augmentation. Saphenofemoral Junction: No evidence of thrombus. Normal compressibility and flow on color Doppler imaging. Profunda Femoral Vein: No evidence of thrombus. Normal compressibility and flow  on color Doppler imaging. Femoral Vein: No evidence of thrombus. Normal compressibility, respiratory phasicity and response to augmentation. Popliteal Vein: No evidence of thrombus. Normal compressibility, respiratory phasicity and response to augmentation. Calf Veins: Thrombus is noted within peroneal vein extending into the tibioperoneal trunk Superficial Great Saphenous Vein: No evidence of thrombus. Normal compressibility and flow on color Doppler imaging. Venous Reflux:  None. Other Findings:  None. IMPRESSION: Infrapopliteal DVT Electronically Signed   By: Inez Catalina M.D.   On: 02/03/2016 15:29    Scheduled Meds: . loratadine  10 mg Oral Daily  . multivitamin with minerals  1 tablet Oral Daily   Continuous Infusions: . heparin 1,400 Units/hr (02/04/16 0131)    Principal Problem:   PE (pulmonary embolism) Active Problems:   DVT (deep venous thrombosis) ([place])    Time spent: [facility] Pager [phone] . If 7PM-7AM, please contact night-coverage at [email or web address], password [facility] 02/04/2016, 8:58 AM  LOS: 1 day

## 2016-02-05 NOTE — Progress Notes (Signed)
ANTICOAGULATION CONSULT NOTE - Follow Up Consult  Pharmacy Consult for Heparin Indication: pulmonary embolus  No Known Allergies  Patient Measurements: Height: 5\' 9"  (175.3 cm) Weight: 210 lb 6.4 oz (95.437 kg) IBW/kg (Calculated) : 66.2 Heparin Dosing Weight: 85 kg  Vital Signs: Temp: 98.7 F (37.1 C) (05/06 0500) Temp Source: Oral (05/06 0500) BP: 113/75 mmHg (05/06 0500) Pulse Rate: 68 (05/06 0500)  Labs:  Recent Labs  02/03/16 1345  02/03/16 2137 02/04/16 0421 02/04/16 0932 02/05/16 0309  HGB 13.8  --   --   --   --  12.8  HCT 40.0  --   --   --   --  37.4  PLT 203  --   --   --   --  214  APTT 27  --   --   --   --   --   HEPARINUNFRC  --   < > 0.43  --  0.36 0.39  CREATININE 0.72  --   --  0.78  --  0.74  TROPONINI <0.03  --   --   --   --   --   < > = values in this interval not displayed.  Estimated Creatinine Clearance: 126.5 mL/min (by C-G formula based on Cr of 0.74).   Medications:  Infusions:  . heparin 1,400 Units/hr (02/04/16 2136)   Assessment: 30 yo F presents on 5/4 with SOB. CT showed extensive PE bilaterally with evidence of R heart strain. Now on IV heparin for acute PE. Has been on birth control PTA. Last HL remains therapeutic at 0.39. Hgb and plts stable. No s/s of bleed.  Goal of Therapy:  Heparin level 0.3-0.7 units/ml Monitor platelets by anticoagulation protocol: Yes   Plan:  Continue heparin gtt at 1,400 units/hr Monitor daily HL, CBC, s/s of bleed   Joya San, PharmD Clinical Pharmacy Resident Pager # [phone] 02/05/2016 8:41 AM

## 2016-02-05 NOTE — Plan of Care (Signed)
Problem: Education: Goal: Knowledge of [facility] Education information/materials will improve Outcome: Completed/Met Date Met:  02/05/16 Pt educated throughout,  hospitalization regarding pain management, tests, procedures, lab results, and available resources.    Problem: Safety: Goal: Ability to remain free from injury will improve Outcome: Completed/Met Date Met:  02/05/16 Pt remained free from injury

## 2016-02-05 NOTE — Plan of Care (Signed)
Problem: Consults Goal: Venous Thromboembolism Patient Education See Patient Education Module for education specifics. Outcome: Completed/Met Date Met:  02/05/16 Pt received education regarding venous thromboembolism  Goal: Diagnosis - Venous Thromboembolism (VTE) Choose a selection Outcome: Completed/Met Date Met:  02/05/16 PE (Pulmonary Embolism) and Venous thrombosis      Goal: Pharmacy Consult for anticoagulation Outcome: Completed/Met Date Met:  02/05/16 Pt on IV heparin and now on subcu lovenox   Problem: Phase I Progression Outcomes Goal: Pain controlled with appropriate interventions Outcome: Completed/Met Date Met:  02/05/16 Pt remains free from pain Goal: Dyspnea controlled at rest (PE) Outcome: Completed/Met Date Met:  02/05/16 Pt dyspnea is controlled at rest Goal: Tolerating diet Outcome: Completed/Met Date Met:  02/05/16 Pt able to tolerate diet with no difficulties   Problem: Phase II Progression Outcomes Goal: 02 sats trending upward/stable (PE) Outcome: Completed/Met Date Met:  02/05/16 Pt o2 sats remain in the high 90's Goal: Tolerating diet Outcome: Completed/Met Date Met:  02/05/16 Pt able to tolerate regular diet with no difficulties   Problem: Phase III Progression Outcomes Goal: 02 sats stabilized Outcome: Completed/Met Date Met:  02/05/16 Pt o2 sats are stabilized

## 2016-02-06 DIAGNOSIS — I82432 Acute embolism and thrombosis of left popliteal vein: Secondary | ICD-10-CM

## 2016-02-06 LAB — BASIC METABOLIC PANEL
Anion gap: 14 (ref 5–15)
BUN: 9 mg/dL (ref 6–20)
CO2: 20 mmol/L — ABNORMAL LOW (ref 22–32)
Calcium: 9.2 mg/dL (ref 8.9–10.3)
Chloride: 105 mmol/L (ref 101–111)
Creatinine, Ser: 0.68 mg/dL (ref 0.44–1.00)
GFR calc Af Amer: >60 mL/min (ref >60)
GFR calc non Af Amer: >60 mL/min (ref >60)
Glucose, Bld: 94 mg/dL (ref 65–99)
Potassium: 4.2 mmol/L (ref 3.5–5.1)
Sodium: 139 mmol/L (ref 135–145)

## 2016-02-06 LAB — CBC
HCT: 36 % (ref 36.0–46.0)
Hemoglobin: 12 g/dL (ref 12.0–15.0)
MCH: 31.7 pg (ref 26.0–34.0)
MCHC: 33.3 g/dL (ref 30.0–36.0)
MCV: 95.2 fL (ref 78.0–100.0)
PLATELETS: 224 10*3/uL (ref 150–400)
RBC: 3.78 MIL/uL — ABNORMAL LOW (ref 3.87–5.11)
RDW: 12 % (ref 11.5–15.5)
WBC: 5.9 10*3/uL (ref 4.0–10.5)

## 2016-02-06 MED ORDER — RIVAROXABAN 20 MG PO TABS: 20.0000 mg | ORAL_TABLET | Freq: Every day | ORAL | Status: DC

## 2016-02-06 MED ORDER — RIVAROXABAN (XARELTO) VTE STARTER PACK (15 & 20 MG): ORAL_TABLET | ORAL | Status: DC

## 2016-02-06 MED ORDER — RIVAROXABAN 15 MG PO TABS
15.0000 mg | ORAL_TABLET | Freq: Two times a day (BID) | ORAL | Status: DC
  Administered 2016-02-06: 15 mg via ORAL
  Filled 2016-02-06: qty 1

## 2016-02-06 NOTE — Discharge Instructions (Addendum)
Information on my medicine - XARELTO (rivaroxaban)  This medication education was reviewed with me or my healthcare representative as part of my discharge preparation.  The pharmacist that spoke with me during my hospital stay was:  Roma Schanz, [place]? Xarelto was prescribed to treat blood clots that may have been found in the veins of your legs (deep vein thrombosis) or in your lungs (pulmonary embolism) and to reduce the risk of them occurring again.  What do you need to know about Xarelto? The starting dose is one 15 mg tablet taken TWICE daily with food for the FIRST 21 DAYS then  the dose is changed to one 20 mg tablet taken ONCE A DAY with your evening meal.  DO NOT stop taking Xarelto without talking to the health care provider who prescribed the medication.  Refill your prescription for 20 mg tablets before you run out.  After discharge, you should have regular check-up appointments with your healthcare provider that is prescribing your Xarelto.  In the future your dose may need to be changed if your kidney function changes by a significant amount.  What do you do if you miss a dose? If you are taking Xarelto TWICE DAILY and you miss a dose, take it as soon as you remember. You may take two 15 mg tablets (total 30 mg) at the same time then resume your regularly scheduled 15 mg twice daily the next day.  If you are taking Xarelto ONCE DAILY and you miss a dose, take it as soon as you remember on the same day then continue your regularly scheduled once daily regimen the next day. Do not take two doses of Xarelto at the same time.   Important Safety Information Xarelto is a blood thinner medicine that can cause bleeding. You should call your healthcare provider right away if you experience any of the following: ? Bleeding from an injury or your nose that does not stop. ? Unusual colored urine (red or dark brown) or unusual colored stools  (red or black). ? Unusual bruising for unknown reasons. ? A serious fall or if you hit your head (even if there is no bleeding).  Some medicines may interact with Xarelto and might increase your risk of bleeding while on Xarelto. To help avoid this, consult your healthcare provider or pharmacist prior to using any new prescription or non-prescription medications, including herbals, vitamins, non-steroidal anti-inflammatory drugs (NSAIDs) and supplements.   How and Where to Give Subcutaneous Fondaparinux Injections Fondaparinux is an injectable blood thinner (anticoagulant) medication that "thins" the blood and helps to prevent blood clots from developing in your veins. Sometimes it is used with other medicine to treat blood clots that have developed. If blood clots are left untreated, they can travel to your heart, lungs, or brain and cause serious illness or death.  You inject fondaparinux through a syringe into your stomach (abdomen). You should change the injection site each time you give yourself a shot. Keep your medicine safely stored at room temperatures. Before giving yourself a shot, make sure the medicine is clear and colorless. If your medicine becomes discolored or has particles in it, do not use it, and let your health care provider know right away. RISKS AND COMPLICATIONS Generally, self-injection of fondaparinux is safe. However, problems can occur and include: Mild Mild complications include:  Bruising around the injection site.  Mild irritation at the site of injection, such as pain, itching, or redness of  skin. Severe Severe complications include:  Bleeding into your bowels or kidneys.  Allergic reaction to fondaparinux.  Injury to structures near the injection site.  Long-term or permanent paralysis from a blood clot in or around your spine. This can happen if you are taking fondaparinux and have epidural block, spinal anesthesia, or spinal tap while you are taking  fondaparinux.  Heparin-induced thrombocytopenia, which is a rare condition that causes a decrease in your blood platelet count. If you have had this condition before, you should tell your health care provider. HOW TO INJECT FONDAPARINUX  Clean the injection site you have selected as directed by your health care provider.  Twist the plunger cap and remove it.  Hold the syringe with one hand and use your other hand to twist the rigid needle guard (covers the needle) counterclockwise. Pull the rigid needle guard straight off the needle. Discard the needle guard.  When using the prefilled syringes, do not expel the air bubble from the syringe before the injection. The air bubble helps to inject all of the medication when it is given.  The injection should be given just underneath your skin, into the fat of your belly (subcutaneously). The shots should be injected around the outside of your belly, several inches away from your navel. The whole length of the needle should go into a fold of skin that you should hold between your thumb and forefinger. The skin fold should be held the entire time you give the shot.  Push the plunger of the syringe all the way down as far as it will go. This will ensure you have injected all of the medicine. Do not rub the injection site after the injection. This increases bruising.  Remove the syringe from the injection site, keeping your finger on the plunger. Immediately place the syringe into a sharps or other hard-sided container with a secure lid.  Fondaparinux injection prefilled syringes and graduated prefilled syringes are available with a system that shields the needle after injection. After injection, when the syringe is empty, set off the safety system by firmly pushing the plunger rod. The protective sleeve will automatically cover the needle and you can hear a click. The click means your needle is safely covered.  Get rid of the syringe in the nearest needle  box, also called a sharps container. SEEK MEDICAL CARE IF:  You develop any rashes on your skin, such as:  Multiple red dots.  Long red streaks.  You have large areas of bruising on your skin.  You have any worsening of the condition for which you take fondaparinux. SEEK IMMEDIATE MEDICAL CARE IF:   You develop bleeding problems such as:  Vomiting blood or coughing up blood.  Blood in your urine.  Blood in your stool or your stool has a dark, tarry, or coffee-ground appearance.  A sudden nosebleed that does not stop.  You develop chest pain or shortness of breath.  These symptoms may represent a serious problem that is an emergency. Do not wait to see if the symptoms will go away. Get medical help right away. Call your local emergency services (911 in the [place] Do not drive yourself to the hospital.   This information is not intended to replace advice given to you by your health care provider. Make sure you discuss any questions you have with your health care provider.   Document Released: 12/15/2008 Document Revised: 10/09/2014 Document Reviewed: 12/24/2013 Elsevier Interactive Patient Education 2016 [employer]  Pulmonary Embolism A  pulmonary embolism (PE) is a sudden blockage or decrease of blood flow in one lung or both lungs. Most blockages come from a blood clot that travels from the legs or the pelvis to the lungs. PE is a dangerous and potentially life-threatening condition if it is not treated right away. CAUSES A pulmonary embolism occurs most commonly when a blood clot travels from one of your veins to your lungs. Rarely, PE is caused by air, fat, amniotic fluid, or part of a tumor traveling through your veins to your lungs. RISK FACTORS A PE is more likely to develop in:  People who smoke.  People who areolder, especially over 57 years of age.  People who are overweight (obese).  People who sit or lie still for a long time, such as during long-distance  travel (over 4 hours), bed rest, hospitalization, or during recovery from certain medical conditions like a stroke.  People who do not engage in much physical activity (sedentary lifestyle).  People who have chronic breathing disorders.  People whohave a personal or family history of blood clots or blood clotting disease.  People whohave peripheral vascular disease (PVD), diabetes, or some types of cancer.  People who haveheart disease, especially if the person had a recent heart attack or has congestive heart failure.  People who have neurological diseases that affect the legs (leg paresis).  People who have had a traumatic injury, such as breaking a hip or leg.  People whohave recently had major or lengthy surgery, especially on the hip, knee, or abdomen.  People who have hada central line placed inside a large vein.  People who takemedicines that contain the hormone estrogen. These include birth control pills and hormone replacement therapy.  Pregnancy or during childbirth or the postpartum period. SIGNS AND SYMPTOMS  The symptoms of a PE usually start suddenly and include:  Shortness of breath while active or at rest.  Coughing or coughing up blood or blood-tinged mucus.  Chest pain that is often worse with deep breaths.  Rapid or irregular heartbeat.  Feeling light-headed or dizzy.  Fainting.  Feelinganxious.  Sweating. There may also be pain and swelling in a leg if that is where the blood clot started. These symptoms may represent a serious problem that is an emergency. Do not wait to see if the symptoms will go away. Get medical help right away. Call your local emergency services (911 in the [place] Do not drive yourself to the hospital. DIAGNOSIS Your health care provider will take a medical history and perform a physical exam. You may also have other tests, including:  Blood tests to assess the clotting properties of your blood, assess oxygen levels in  your blood, and find blood clots.  Imaging tests, such as CT, ultrasound, MRI, X-ray, and other tests to see if you have clots anywhere in your body.  An electrocardiogram (ECG) to look for heart strain from blood clots in the lungs. TREATMENT The main goals of PE treatment are:  To stop a blood clot from growing larger.  To stop new blood clots from forming. The type of treatment that you receive depends on many factors, such as the cause of your PE, your risk for bleeding or developing more clots, and other medical conditions that you have. Sometimes, a combination of treatments is necessary. This condition may be treated with:  Medicines, including newer oral blood thinners (anticoagulants), warfarin, low molecular weight heparins, thrombolytics, or heparins.  Wearing compression stockings or using different types of devices.  Surgery (rare) to remove the blood clot or to place a filter in your abdomen to stop the blood clot from traveling to your lungs. Treatments for a PE are often divided into immediate treatment, long-term treatment (up to 3 months after PE), and extended treatment (more than 3 months after PE). Your treatment may continue for several months. This is called maintenance therapy, and it is used to prevent the forming of new blood clots. You can work with your health care provider to choose the treatment program that is best for you. What are anticoagulants? Anticoagulants are medicines that treat PEs. They can stop current blood clots from growing and stop new clots from forming. They cannot dissolve existing clots. Your body dissolves clots by itself over time. Anticoagulants are given by mouth, by injection, or through an IV tube. What are thrombolytics? Thrombolytics are clot-dissolving medicines that are used to dissolve a PE. They carry a high risk of bleeding, so they tend to be used only in severe cases or if you have very low blood pressure. HOME CARE  INSTRUCTIONS If you are taking a newer oral anticoagulant:  Take the medicine every single day at the same time each day.  Understand what foods and drugs interact with this medicine.  Understand that there are no regular blood tests required when using this medicine.  Understandthe side effects of this medicine, including excessive bruising or bleeding. Ask your health care provider or pharmacist about other possible side effects. If you are taking warfarin:  Understand how to take warfarin and know which foods can affect how warfarin works in Veterinary surgeon.  Understand that it is dangerous to taketoo much or too little warfarin. Too much warfarin increases the risk of bleeding. Too little warfarin continues to allow the risk for blood clots.  Follow your PT and INR blood testing schedule. The PT and INR results allow your health care provider to adjust your dose of warfarin. It is very important that you have your PT and INR tested as often as told by your health care provider.  Avoid major changes in your diet, or tell your health care provider before you change your diet. Arrange a visit with a registered dietitian to answer your questions. Many foods, especially foods that are high in vitamin K, can interfere with warfarin and affect the PT and INR results. Eat a consistent amount of foods that are high in vitamin K, such as:  Spinach, kale, broccoli, cabbage, collard greens, turnip greens, Brussels sprouts, peas, cauliflower, seaweed, and parsley.  Beef liver and pork liver.  Green tea.  Soybean oil.  Tell your health care provider about any and all medicines, vitamins, and supplements that you take, including aspirin and other over-the-counter anti-inflammatory medicines. Be especially cautious with aspirin and anti-inflammatory medicines. Do not take those before you ask your health care provider if it is safe to do so. This is important because many medicines can interfere with  warfarin and affect the PT and INR results.  Do not start or stop taking any over-the-counter or prescription medicine unless your health care provider or pharmacist tells you to do so. If you take warfarin, you will also need to do these things:  Hold pressure over cuts for longer than usual.  Tell your dentist and other health care providers that you are taking warfarin before you have any procedures in which bleeding may occur.  Avoid alcohol or drink very small amounts. Tell your health care provider if you change  your alcohol intake.  Do not use tobacco products, including cigarettes, chewing tobacco, and e-cigarettes. If you need help quitting, ask your health care provider.  Avoid contact sports. General Instructions  Take over-the-counter and prescription medicines only as told by your health care provider. Anticoagulant medicines can have side effects, including easy bruising and difficulty stopping bleeding. If you are prescribed an anticoagulant, you will also need to do these things:  Hold pressure over cuts for longer than usual.  Tell your dentist and other health care providers that you are taking anticoagulants before you have any procedures in which bleeding may occur.  Avoid contact sports.  Wear a medical alert bracelet or carry a medical alert card that says you have had a PE.  Ask your health care provider how soon you can go back to your normal activities. Stay active to prevent new blood clots from forming.  Make sure to exercise while traveling or when you have been sitting or standing for a long period of time. It is very important to exercise. Exercise your legs by walking or by tightening and relaxing your leg muscles often. Take frequent walks.  Wear compression stockings as told by your health care provider to help prevent more blood clots from forming.  Do not use tobacco products, including cigarettes, chewing tobacco, and e-cigarettes. If you need help  quitting, ask your health care provider.  Keep all follow-up appointments with your health care provider. This is important. PREVENTION Take these actions to decrease your risk of developing another PE:  Exercise regularly. For at least 30 minutes every day, engage in:  Activity that involves moving your arms and legs.  Activity that encourages good blood flow through your body by increasing your heart rate.  Exercise your arms and legs every hour during long-distance travel (over 4 hours). Drink plenty of water and avoid drinking alcohol while traveling.  Avoid sitting or lying in bed for long periods of time without moving your legs.  Maintain a weight that is appropriate for your height. Ask your health care provider what weight is healthy for you.  If you are a woman who is over 33 years of age, avoid unnecessary use of medicines that contain estrogen. These include birth control pills.  Do not smoke, especially if you take estrogen medicines. If you need help quitting, ask your health care provider.  If you are at very high risk for PE, wear compression stockings.  If you recently had a PE, have regularly scheduled ultrasound testing on your legs to check for new blood clots. If you are hospitalized, prevention measures may include:  Early walking after surgery, as soon as your health care provider says that it is safe.  Receiving anticoagulants to prevent blood clots. If you cannot take anticoagulants, other options may be available, such as wearing compression stockings or using different types of devices. SEEK IMMEDIATE MEDICAL CARE IF:  You have new or increased pain, swelling, or redness in an arm or leg.  You have numbness or tingling in an arm or leg.  You have shortness of breath while active or at rest.  You have chest pain.  You have a rapid or irregular heartbeat.  You feel light-headed or dizzy.  You cough up blood.  You notice blood in your vomit,  bowel movement, or urine.  You have a fever. These symptoms may represent a serious problem that is an emergency. Do not wait to see if the symptoms will go away. Get  medical help right away. Call your local emergency services (911 in the [place] Do not drive yourself to the hospital.   This information is not intended to replace advice given to you by your health care provider. Make sure you discuss any questions you have with your health care provider.   Document Released: 09/15/2000 Document Revised: 06/09/2015 Document Reviewed: 01/13/2015 Elsevier Interactive Patient Education 2016 [employer]. This website has more information on Xarelto: [email or web address]   Pulmonary Embolism A pulmonary embolism (PE) is a sudden blockage or decrease of blood flow in one lung or both lungs. Most blockages come from a blood clot that travels from the legs or the pelvis to the lungs. PE is a dangerous and potentially life-threatening condition if it is not treated right away. CAUSES A pulmonary embolism occurs most commonly when a blood clot travels from one of your veins to your lungs. Rarely, PE is caused by air, fat, amniotic fluid, or part of a tumor traveling through your veins to your lungs. RISK FACTORS A PE is more likely to develop in:  People who smoke.  People who areolder, especially over 90 years of age.  People who are overweight (obese).  People who sit or lie still for a long time, such as during long-distance travel (over 4 hours), bed rest, hospitalization, or during recovery from certain medical conditions like a stroke.  People who do not engage in much physical activity (sedentary lifestyle).  People who have chronic breathing disorders.  People whohave a personal or family history of blood clots or blood clotting disease.  People whohave peripheral vascular disease (PVD), diabetes, or some types of cancer.  People who haveheart disease, especially if the person had a  recent heart attack or has congestive heart failure.  People who have neurological diseases that affect the legs (leg paresis).  People who have had a traumatic injury, such as breaking a hip or leg.  People whohave recently had major or lengthy surgery, especially on the hip, knee, or abdomen.  People who have hada central line placed inside a large vein.  People who takemedicines that contain the hormone estrogen. These include birth control pills and hormone replacement therapy.  Pregnancy or during childbirth or the postpartum period. SIGNS AND SYMPTOMS  The symptoms of a PE usually start suddenly and include:  Shortness of breath while active or at rest.  Coughing or coughing up blood or blood-tinged mucus.  Chest pain that is often worse with deep breaths.  Rapid or irregular heartbeat.  Feeling light-headed or dizzy.  Fainting.  Feelinganxious.  Sweating. There may also be pain and swelling in a leg if that is where the blood clot started. These symptoms may represent a serious problem that is an emergency. Do not wait to see if the symptoms will go away. Get medical help right away. Call your local emergency services (911 in the [place] Do not drive yourself to the hospital. DIAGNOSIS Your health care provider will take a medical history and perform a physical exam. You may also have other tests, including:  Blood tests to assess the clotting properties of your blood, assess oxygen levels in your blood, and find blood clots.  Imaging tests, such as CT, ultrasound, MRI, X-ray, and other tests to see if you have clots anywhere in your body.  An electrocardiogram (ECG) to look for heart strain from blood clots in the lungs. TREATMENT The main goals of PE treatment are:  To stop a blood clot from growing  larger.  To stop new blood clots from forming. The type of treatment that you receive depends on many factors, such as the cause of your PE, your risk for  bleeding or developing more clots, and other medical conditions that you have. Sometimes, a combination of treatments is necessary. This condition may be treated with:  Medicines, including newer oral blood thinners (anticoagulants), warfarin, low molecular weight heparins, thrombolytics, or heparins.  Wearing compression stockings or using different types of devices.  Surgery (rare) to remove the blood clot or to place a filter in your abdomen to stop the blood clot from traveling to your lungs. Treatments for a PE are often divided into immediate treatment, long-term treatment (up to 3 months after PE), and extended treatment (more than 3 months after PE). Your treatment may continue for several months. This is called maintenance therapy, and it is used to prevent the forming of new blood clots. You can work with your health care provider to choose the treatment program that is best for you. What are anticoagulants? Anticoagulants are medicines that treat PEs. They can stop current blood clots from growing and stop new clots from forming. They cannot dissolve existing clots. Your body dissolves clots by itself over time. Anticoagulants are given by mouth, by injection, or through an IV tube. What are thrombolytics? Thrombolytics are clot-dissolving medicines that are used to dissolve a PE. They carry a high risk of bleeding, so they tend to be used only in severe cases or if you have very low blood pressure. HOME CARE INSTRUCTIONS If you are taking a newer oral anticoagulant:  Take the medicine every single day at the same time each day.  Understand what foods and drugs interact with this medicine.  Understand that there are no regular blood tests required when using this medicine.  Understandthe side effects of this medicine, including excessive bruising or bleeding. Ask your health care provider or pharmacist about other possible side effects. If you are taking warfarin:  Understand  how to take warfarin and know which foods can affect how warfarin works in Veterinary surgeon.  Understand that it is dangerous to taketoo much or too little warfarin. Too much warfarin increases the risk of bleeding. Too little warfarin continues to allow the risk for blood clots.  Follow your PT and INR blood testing schedule. The PT and INR results allow your health care provider to adjust your dose of warfarin. It is very important that you have your PT and INR tested as often as told by your health care provider.  Avoid major changes in your diet, or tell your health care provider before you change your diet. Arrange a visit with a registered dietitian to answer your questions. Many foods, especially foods that are high in vitamin K, can interfere with warfarin and affect the PT and INR results. Eat a consistent amount of foods that are high in vitamin K, such as:  Spinach, kale, broccoli, cabbage, collard greens, turnip greens, Brussels sprouts, peas, cauliflower, seaweed, and parsley.  Beef liver and pork liver.  Green tea.  Soybean oil.  Tell your health care provider about any and all medicines, vitamins, and supplements that you take, including aspirin and other over-the-counter anti-inflammatory medicines. Be especially cautious with aspirin and anti-inflammatory medicines. Do not take those before you ask your health care provider if it is safe to do so. This is important because many medicines can interfere with warfarin and affect the PT and INR results.  Do not  start or stop taking any over-the-counter or prescription medicine unless your health care provider or pharmacist tells you to do so. If you take warfarin, you will also need to do these things:  Hold pressure over cuts for longer than usual.  Tell your dentist and other health care providers that you are taking warfarin before you have any procedures in which bleeding may occur.  Avoid alcohol or drink very small amounts.  Tell your health care provider if you change your alcohol intake.  Do not use tobacco products, including cigarettes, chewing tobacco, and e-cigarettes. If you need help quitting, ask your health care provider.  Avoid contact sports. General Instructions  Take over-the-counter and prescription medicines only as told by your health care provider. Anticoagulant medicines can have side effects, including easy bruising and difficulty stopping bleeding. If you are prescribed an anticoagulant, you will also need to do these things:  Hold pressure over cuts for longer than usual.  Tell your dentist and other health care providers that you are taking anticoagulants before you have any procedures in which bleeding may occur.  Avoid contact sports.  Wear a medical alert bracelet or carry a medical alert card that says you have had a PE.  Ask your health care provider how soon you can go back to your normal activities. Stay active to prevent new blood clots from forming.  Make sure to exercise while traveling or when you have been sitting or standing for a long period of time. It is very important to exercise. Exercise your legs by walking or by tightening and relaxing your leg muscles often. Take frequent walks.  Wear compression stockings as told by your health care provider to help prevent more blood clots from forming.  Do not use tobacco products, including cigarettes, chewing tobacco, and e-cigarettes. If you need help quitting, ask your health care provider.  Keep all follow-up appointments with your health care provider. This is important. PREVENTION Take these actions to decrease your risk of developing another PE:  Exercise regularly. For at least 30 minutes every day, engage in:  Activity that involves moving your arms and legs.  Activity that encourages good blood flow through your body by increasing your heart rate.  Exercise your arms and legs every hour during long-distance  travel (over 4 hours). Drink plenty of water and avoid drinking alcohol while traveling.  Avoid sitting or lying in bed for long periods of time without moving your legs.  Maintain a weight that is appropriate for your height. Ask your health care provider what weight is healthy for you.  If you are a woman who is over 72 years of age, avoid unnecessary use of medicines that contain estrogen. These include birth control pills.  Do not smoke, especially if you take estrogen medicines. If you need help quitting, ask your health care provider.  If you are at very high risk for PE, wear compression stockings.  If you recently had a PE, have regularly scheduled ultrasound testing on your legs to check for new blood clots. If you are hospitalized, prevention measures may include:  Early walking after surgery, as soon as your health care provider says that it is safe.  Receiving anticoagulants to prevent blood clots. If you cannot take anticoagulants, other options may be available, such as wearing compression stockings or using different types of devices. SEEK IMMEDIATE MEDICAL CARE IF:  You have new or increased pain, swelling, or redness in an arm or leg.  You have  numbness or tingling in an arm or leg.  You have shortness of breath while active or at rest.  You have chest pain.  You have a rapid or irregular heartbeat.  You feel light-headed or dizzy.  You cough up blood.  You notice blood in your vomit, bowel movement, or urine.  You have a fever. These symptoms may represent a serious problem that is an emergency. Do not wait to see if the symptoms will go away. Get medical help right away. Call your local emergency services (911 in the [place] Do not drive yourself to the hospital.   This information is not intended to replace advice given to you by your health care provider. Make sure you discuss any questions you have with your health care provider.   Document Released:  09/15/2000 Document Revised: 06/09/2015 Document Reviewed: 01/13/2015 Elsevier Interactive Patient Education 2016 [employer].   Deep Vein Thrombosis A deep vein thrombosis (DVT) is a blood clot (thrombus) that usually occurs in a deep, larger vein of the lower leg or the pelvis, or in an upper extremity such as the arm. These are dangerous and can lead to serious and even life-threatening complications if the clot travels to the lungs. A DVT can damage the valves in your leg veins so that instead of flowing upward, the blood pools in the lower leg. This is called post-thrombotic syndrome, and it can result in pain, swelling, discoloration, and sores on the leg. CAUSES A DVT is caused by the formation of a blood clot in your leg, pelvis, or arm. Usually, several things contribute to the formation of blood clots. A clot may develop when:  Your blood flow slows down.  Your vein becomes damaged in some way.  You have a condition that makes your blood clot more easily. RISK FACTORS A DVT is more likely to develop in:  People who are older, especially over 41 years of age.  People who are overweight (obese).  People who sit or lie still for a long time, such as during long-distance travel (over 4 hours), bed rest, hospitalization, or during recovery from certain medical conditions like a stroke.  People who do not engage in much physical activity (sedentary lifestyle).  People who have chronic breathing disorders.  People who have a personal or family history of blood clots or blood clotting disease.  People who have peripheral vascular disease (PVD), diabetes, or some types of cancer.  People who have heart disease, especially if the person had a recent heart attack or has congestive heart failure.  People who have neurological diseases that affect the legs (leg paresis).  People who have had a traumatic injury, such as breaking a hip or leg.  People who have recently had major or  lengthy surgery, especially on the hip, knee, or abdomen.  People who have had a central line placed inside a large vein.  People who take medicines that contain the hormone estrogen. These include birth control pills and hormone replacement therapy.  Pregnancy or during childbirth or the postpartum period.  Long plane flights (over 8 hours). SIGNS AND SYMPTOMS Symptoms of a DVT can include:   Swelling of your leg or arm, especially if one side is much worse.  Warmth and redness of your leg or arm, especially if one side is much worse.  Pain in your arm or leg. If the clot is in your leg, symptoms may be more noticeable or worse when you stand or walk.  A feeling  of pins and needles, if the clot is in the arm. The symptoms of a DVT that has traveled to the lungs (pulmonary embolism, PE) usually start suddenly and include:  Shortness of breath while active or at rest.  Coughing or coughing up blood or blood-tinged mucus.  Chest pain that is often worse with deep breaths.  Rapid or irregular heartbeat.  Feeling light-headed or dizzy.  Fainting.  Feeling anxious.  Sweating. There may also be pain and swelling in a leg if that is where the blood clot started. These symptoms may represent a serious problem that is an emergency. Do not wait to see if the symptoms will go away. Get medical help right away. Call your local emergency services (911 in the [place] Do not drive yourself to the hospital. DIAGNOSIS Your health care provider will take a medical history and perform a physical exam. You may also have other tests, including:  Blood tests to assess the clotting properties of your blood.  Imaging tests, such as CT, ultrasound, MRI, X-ray, and other tests to see if you have clots anywhere in your body. TREATMENT After a DVT is identified, it can be treated. The type of treatment that you receive depends on many factors, such as the cause of your DVT, your risk for bleeding or  developing more clots, and other medical conditions that you have. Sometimes, a combination of treatments is necessary. Treatment options may be combined and include:  Monitoring the blood clot with ultrasound.  Taking medicines by mouth, such as newer blood thinners (anticoagulants), thrombolytics, or warfarin.  Taking anticoagulant medicine by injection or through an IV tube.  Wearing compression stockings or using different types ofdevices.  Surgery (rare) to remove the blood clot or to place a filter in your abdomen to stop the blood clot from traveling to your lungs. Treatments for a DVT are often divided into immediate treatment and long-term treatment (up to 3 months after DVT). You can work with your health care provider to choose the treatment program that is best for you. HOME CARE INSTRUCTIONS If you are taking a newer oral anticoagulant:  Take the medicine every single day at the same time each day.  Understand what foods and drugs interact with this medicine.  Understand that there are no regular blood tests required when using this medicine.  Understand the side effects of this medicine, including excessive bruising or bleeding. Ask your health care provider or pharmacist about other possible side effects. If you are taking warfarin:  Understand how to take warfarin and know which foods can affect how warfarin works in Veterinary surgeon.  Understand that it is dangerous to take too much or too little warfarin. Too much warfarin increases the risk of bleeding. Too little warfarin continues to allow the risk for blood clots.  Follow your PT and INR blood testing schedule. The PT and INR results allow your health care provider to adjust your dose of warfarin. It is very important that you have your PT and INR tested as often as told by your health care provider.  Avoid major changes in your diet, or tell your health care provider before you change your diet. Arrange a visit with a  registered dietitian to answer your questions. Many foods, especially foods that are high in vitamin K, can interfere with warfarin and affect the PT and INR results. Eat a consistent amount of foods that are high in vitamin K, such as:  Spinach, kale, broccoli, cabbage, collard greens,  turnip greens, Brussels sprouts, peas, cauliflower, seaweed, and parsley.  Beef liver and pork liver.  Green tea.  Soybean oil.  Tell your health care provider about any and all medicines, vitamins, and supplements that you take, including aspirin and other over-the-counter anti-inflammatory medicines. Be especially cautious with aspirin and anti-inflammatory medicines. Do not take those before you ask your health care provider if it is safe to do so. This is important because many medicines can interfere with warfarin and affect the PT and INR results.  Do not start or stop taking any over-the-counter or prescription medicine unless your health care provider or pharmacist tells you to do so. If you take warfarin, you will also need to do these things:  Hold pressure over cuts for longer than usual.  Tell your dentist and other health care providers that you are taking warfarin before you have any procedures in which bleeding may occur.  Avoid alcohol or drink very small amounts. Tell your health care provider if you change your alcohol intake.  Do not use tobacco products, including cigarettes, chewing tobacco, and e-cigarettes. If you need help quitting, ask your health care provider.  Avoid contact sports. General Instructions  Take over-the-counter and prescription medicines only as told by your health care provider. Anticoagulant medicines can have side effects, including easy bruising and difficulty stopping bleeding. If you are prescribed an anticoagulant, you will also need to do these things:  Hold pressure over cuts for longer than usual.  Tell your dentist and other health care providers that  you are taking anticoagulants before you have any procedures in which bleeding may occur.  Avoid contact sports.  Wear a medical alert bracelet or carry a medical alert card that says you have had a PE.  Ask your health care provider how soon you can go back to your normal activities. Stay active to prevent new blood clots from forming.  Make sure to exercise while traveling or when you have been sitting or standing for a long period of time. It is very important to exercise. Exercise your legs by walking or by tightening and relaxing your leg muscles often. Take frequent walks.  Wear compression stockings as told by your health care provider to help prevent more blood clots from forming.  Do not use tobacco products, including cigarettes, chewing tobacco, and e-cigarettes. If you need help quitting, ask your health care provider.  Keep all follow-up appointments with your health care provider. This is important. PREVENTION Take these actions to decrease your risk of developing another DVT:  Exercise regularly. For at least 30 minutes every day, engage in:  Activity that involves moving your arms and legs.  Activity that encourages good blood flow through your body by increasing your heart rate.  Exercise your arms and legs every hour during long-distance travel (over 4 hours). Drink plenty of water and avoid drinking alcohol while traveling.  Avoid sitting or lying in bed for long periods of time without moving your legs.  Maintain a weight that is appropriate for your height. Ask your health care provider what weight is healthy for you.  If you are a woman who is over 28 years of age, avoid unnecessary use of medicines that contain estrogen. These include birth control pills.  Do not smoke, especially if you take estrogen medicines. If you need help quitting, ask your health care provider. If you are hospitalized, prevention measures may include:  Early walking after surgery, as  soon as your health care  provider says that it is safe.  Receiving anticoagulants to prevent blood clots.If you cannot take anticoagulants, other options may be available, such as wearing compression stockings or using different types of devices. SEEK IMMEDIATE MEDICAL CARE IF:  You have new or increased pain, swelling, or redness in an arm or leg.  You have numbness or tingling in an arm or leg.  You have shortness of breath while active or at rest.  You have chest pain.  You have a rapid or irregular heartbeat.  You feel light-headed or dizzy.  You cough up blood.  You notice blood in your vomit, bowel movement, or urine. These symptoms may represent a serious problem that is an emergency. Do not wait to see if the symptoms will go away. Get medical help right away. Call your local emergency services (911 in the [place] Do not drive yourself to the hospital.   This information is not intended to replace advice given to you by your health care provider. Make sure you discuss any questions you have with your health care provider.   Document Released: 09/18/2005 Document Revised: 06/09/2015 Document Reviewed: 01/13/2015 Elsevier Interactive Patient Education [employer].

## 2016-02-06 NOTE — Discharge Summary (Signed)
Physician Discharge Summary  Patient ID: Gail Foster MRN: [id number] DOB/AGE: 03-07-1986 30 y.o.  Admit date: 02/03/2016 Discharge date: 02/06/2016  Admission Diagnoses: PE  Discharge Diagnoses:  Principal Problem:   PE (pulmonary embolism) Active Problems:   DVT (deep venous thrombosis) ([place])   Discharged Condition: stable  Hospital Course:  Pulmonary Embolism  Heparin gtt initially as patient not a candidate for invasive procedure. Patient has been transitioned to Lovenox and did well. Discussed options of Coumadin versus other oral medications for anticoagulation for the next 6 to my months. Pt then decided on xarelto. 2d echo today-showed no right heart strain BLE [place] to look for source DVT-right lower extremity clean Stopped estrogen containing OCP, discussed this with patient as well  Pt to discuss hypercoag w/u on d/c as op with provider  Consults: Pulm/CC  Significant Diagnostic Studies:   TTE Study Conclusions  - Left ventricle: The cavity size was normal. Systolic function was  normal. The estimated ejection fraction was in the range of 55%  to 60%. Images were inadequate for LV wall motion assessment.  Left ventricular diastolic function parameters were normal.  Ct Angio Chest Pe W/cm &/or Wo Cm  02/03/2016  CLINICAL DATA:  Two day history of shortness of breath.  Anemia. EXAM: CT ANGIOGRAPHY CHEST WITH CONTRAST TECHNIQUE: Multidetector CT imaging of the chest was performed using the standard protocol during bolus administration of intravenous contrast. Multiplanar CT image reconstructions and MIPs were obtained to evaluate the vascular anatomy. CONTRAST:  100 mL Isovue 370 nonionic COMPARISON:  None. FINDINGS: Mediastinum/Lymph Nodes: There is extensive pulmonary embolism arising from the right mid main pulmonary artery with extension into multiple right-sided upper and lower lobe pulmonary arterial branches. There are multiple  left lower lobe segmental pulmonary emboli as well as a pulmonary embolus in the posterior segment left upper lobe pulmonary artery branch. No pulmonary embolus is seen in the left main pulmonary artery. The right ventricle to left ventricle diameter ratio is 1.0, abnormal and consistent with a degree of right heart strain. There is no thoracic aortic aneurysm or dissection. Visualized great vessels appear normal. Right and left common carotid arteries arise as a common trunk, an anatomic variant. Pericardium is not thickened. Visualized thyroid appears normal. There is no demonstrable adenopathy. Lungs/Pleura: Lungs are clear except for occasional small bullae in the lower lobes. No edema or consolidation. No demonstrable pulmonary infarct. No pleural effusion. Upper abdomen: Visualized upper abdominal structures appear unremarkable. Musculoskeletal: No blastic or lytic bone lesions. Review of the MIP images confirms the above findings. IMPRESSION: Extensive pulmonary embolism bilaterally, more pronounced on the right than on the left. Evidence of right heart strain. Positive for acute PE with CT evidence of right heart strain (RV/LV Ratio = 1.0) consistent with at least submassive (intermediate risk) PE. The presence of right heart strain has been associated with an increased risk of morbidity and mortality. Please activate Code PE by paging [phone]. No parenchymal lung edema or consolidation.  No adenopathy. Critical Value/emergent results were called by telephone at the time of interpretation on 02/03/2016 at 2:44 pm to Dr. Carleene Overlie, who verbally acknowledged these results. Electronically Signed   By: Lowella Grip III M.D.   On: 02/03/2016 14:46   US Venous Img Lower Unilateral Left  02/03/2016  CLINICAL DATA:  Left calf pain for 10 days, initial encounter EXAM: Left LOWER EXTREMITY VENOUS DOPPLER ULTRASOUND TECHNIQUE: Gray-scale sonography with graded compression, as well as color Doppler and  duplex ultrasound were  performed to evaluate the lower extremity deep venous systems from the level of the common femoral vein and including the common femoral, femoral, profunda femoral, popliteal and calf veins including the posterior tibial, peroneal and gastrocnemius veins when visible. The superficial great saphenous vein was also interrogated. Spectral Doppler was utilized to evaluate flow at rest and with distal augmentation maneuvers in the common femoral, femoral and popliteal veins. COMPARISON:  None. FINDINGS: Contralateral Common Femoral Vein: Respiratory phasicity is normal and symmetric with the symptomatic side. No evidence of thrombus. Normal compressibility. Common Femoral Vein: No evidence of thrombus. Normal compressibility, respiratory phasicity and response to augmentation. Saphenofemoral Junction: No evidence of thrombus. Normal compressibility and flow on color Doppler imaging. Profunda Femoral Vein: No evidence of thrombus. Normal compressibility and flow on color Doppler imaging. Femoral Vein: No evidence of thrombus. Normal compressibility, respiratory phasicity and response to augmentation. Popliteal Vein: No evidence of thrombus. Normal compressibility, respiratory phasicity and response to augmentation. Calf Veins: Thrombus is noted within peroneal vein extending into the tibioperoneal trunk Superficial Great Saphenous Vein: No evidence of thrombus. Normal compressibility and flow on color Doppler imaging. Venous Reflux:  None. Other Findings:  None. IMPRESSION: Infrapopliteal DVT Electronically Signed   By: Inez Catalina M.D.   On: 02/03/2016 15:29     Treatments: IV heparin  Discharge Exam: Blood pressure 105/61, pulse 61, temperature 98.5 F (36.9 C), temperature source Oral, resp. rate 16, height 5\' 9"  (1.753 m), weight 96.4 kg (212 lb 8.4 oz), last menstrual period 01/12/2016, SpO2 99 %.  General: No resp distress, well appearing, speaking in full  sentences  Cardiovascular: RRR, no MRG  Respiratory: CTAB, normal WOB  Abdomen: ND, nl BS  Musculoskeletal: moving all extr  Disposition: 01-Home or Self Care     Medication List    STOP taking these medications        HAIR/SKIN/NAILS PO     ibuprofen 600 MG tablet  Commonly known as:  ADVIL,MOTRIN     LO LOESTRIN FE 1 MG-10 MCG / 10 MCG tablet  Generic drug:  Norethindrone-Ethinyl Estradiol-Fe Biphas      TAKE these medications        ALPRAZolam 0.5 MG tablet  Commonly known as:  XANAX  Take 0.5 mg by mouth 2 (two) times daily as needed for anxiety.     multivitamin with minerals Tabs tablet  Take 1 tablet by mouth daily.     rivaroxaban 20 MG Tabs tablet  Commonly known as:  XARELTO  Take 1 tablet (20 mg total) by mouth daily with supper.     Rivaroxaban 15 & 20 MG Tbpk  Take as directed on package: Start with one 15mg  tablet by mouth twice a day with food. On Day 22, switch to one 20mg  tablet once a day with food.           Follow-up Information    Follow up with [facility].   Why:  CALL THIS NUMBER TO SECURE A NEW PRIMARY CARE PHYSICIAN   Contact information:   [phone]      Follow up with [facility]. Schedule an appointment as soon as possible for a visit in 6 weeks.   Specialty:  Pulmonology   Why:  [facility] Follow-up   Contact information:   [place] [phone]      Signed: Elwin Mocha 02/06/2016, 5:06 PM

## 2016-02-06 NOTE — Progress Notes (Signed)
CM received call from RN requesting PCP listing.  CM placed [facility] [phone] ON AVS for pt to call to secure a PCP.  No other CM needs were communciated.

## 2016-02-06 NOTE — Progress Notes (Signed)
Ambulated pt in hallway.  Ambulated pt around nurses station 3 times and pt asymptomatic.  O2 sats remained 98-100% on RA and pt only a little short of breath.  Will continue to monitor.

## 2016-02-06 NOTE — Plan of Care (Signed)
Problem: Phase III Progression Outcomes Goal: Activity at appropriate level-compared to baseline (UP IN CHAIR FOR HEMODIALYSIS)  Outcome: Completed/Met Date Met:  02/06/16 Pt able to tolerate activity  Goal: Discharge plan remains appropriate-arrangements made Outcome: Completed/Met Date Met:  02/06/16 Pt being discharged home today   Problem: Discharge Progression Outcomes Goal: Discharge plan in place and appropriate Outcome: Completed/Met Date Met:  02/06/16 Pt being discharged home today  Goal: Pain controlled with appropriate interventions Outcome: Completed/Met Date Met:  02/06/16 Pt remains chest pain free Goal: Tolerating diet Outcome: Completed/Met Date Met:  02/06/16 Pt able to tolerate diet with no difficulties

## 2016-02-07 ENCOUNTER — Telehealth: Payer: Self-pay | Admitting: Family Medicine

## 2016-02-07 NOTE — Telephone Encounter (Signed)
Ok to schedule at 2:00 on Wednesday for 30 minutes

## 2016-02-07 NOTE — Telephone Encounter (Signed)
Pt states that she is looking for a pcp, pt was just discharged from hosp and needs a NP hosp f/u. Please advise ok to schedule.

## 2016-02-07 NOTE — Telephone Encounter (Signed)
Pt has been schedule.

## 2016-02-09 ENCOUNTER — Encounter: Payer: Self-pay | Admitting: Family Medicine

## 2016-02-09 ENCOUNTER — Ambulatory Visit (INDEPENDENT_AMBULATORY_CARE_PROVIDER_SITE_OTHER): Payer: BLUE CROSS/BLUE SHIELD | Admitting: Family Medicine

## 2016-02-09 VITALS — BP 120/70 | HR 76 | Temp 98.4°F | Resp 16 | Ht 69.0 in | Wt 209.4 lb

## 2016-02-09 DIAGNOSIS — I2699 Other pulmonary embolism without acute cor pulmonale: Secondary | ICD-10-CM

## 2016-02-09 DIAGNOSIS — I82432 Acute embolism and thrombosis of left popliteal vein: Secondary | ICD-10-CM

## 2016-02-09 NOTE — Progress Notes (Signed)
   Subjective:    Patient ID: Gail Foster, female    DOB: Feb 28, 1986, 30 y.o.   MRN: [id number]  HPI New to establish.  No previous PCP.  GYN- [facility]  DVT/PE- pt had GYN surgery (dermoid cyst) on 2/22 causing her to be dramatically less active than previously.  Pt was also on OCPs.  No family hx of DVT or recurrent miscarriage.  Pt reports leg pain and SOB for quite awhile but pt was ignoring this as a result of post-op deconditioning.  Had to go to ER on 5/4 due to extreme SOB.  Was found to have 'extensive bilateral PE, R>L w/ R heart strain'.  Was found to have L infrapopliteal DVT.  Was started on Xarelto- still on 15mg  BID until day 21 at which point she will switch to 20mg  daily.  ECHO was WNL.  Pt reports feeling 'better'.  Pt reports breathing is 'better' but continues to get SOB w/ exertion.  Denies CP, visual changes.  + HAs since hospitalization.  Pt is to f/u w/ Dr Elsworth Soho.   Review of Systems For ROS see HPI     Objective:   Physical Exam  Constitutional: She is oriented to person, place, and time. She appears well-developed and well-nourished. No distress.  HENT:  Head: Normocephalic and atraumatic.  Eyes: Conjunctivae and EOM are normal. Pupils are equal, round, and reactive to light.  Neck: Normal range of motion. Neck supple. No thyromegaly present.  Cardiovascular: Normal rate, regular rhythm, normal heart sounds and intact distal pulses.   No murmur heard. Pulmonary/Chest: Effort normal and breath sounds normal. No respiratory distress.  Musculoskeletal: She exhibits no edema or tenderness.  Lymphadenopathy:    She has no cervical adenopathy.  Neurological: She is alert and oriented to person, place, and time.  Skin: Skin is warm and dry.  Psychiatric: She has a normal mood and affect. Her behavior is normal.  Vitals reviewed.         Assessment & Plan:

## 2016-02-09 NOTE — Patient Instructions (Signed)
Schedule your complete physical in 6 months We'll call you with your pulmonary appt Continue the Xarelto twice daily until day 22 and then switch to 20mg  once daily If you have swelling, pain, worsening shortness of breath- please call right away! Call with any questions or concerns Welcome!  We're glad to have you! Hang in there!!!

## 2016-02-09 NOTE — Progress Notes (Signed)
Pre visit review using our clinic review tool, if applicable. No additional management support is needed unless otherwise documented below in the visit note. 

## 2016-02-10 NOTE — Assessment & Plan Note (Signed)
New.  See above.  Plan is 6 months of Xarelto.  Will repeat venous dopplers prior to d/c'ing Xarelto.  Pt expressed understanding and is in agreement w/ plan.

## 2016-02-10 NOTE — Assessment & Plan Note (Signed)
New.  Pt's hospital team felt her DVT and PE were due to a combination of her inactivity after surgery coupled w/ her OCP use.  Pt had an ECHO done while in the hospital and it showed normal EF and normal relaxation.  Pt is now on Xarelto twice daily w/ plan to switch to 20mg  daily on day 22.  She needs a pulmonary appt for f/u- will refer.  sxs are improving- shortness of breath is improving.  Reviewed lifestyle modifications- holding off on CrossFit until she is off Xarelto.  All of pt's questions were answered.  Will follow.

## 2016-02-23 ENCOUNTER — Telehealth: Payer: Self-pay | Admitting: Family Medicine

## 2016-02-23 MED ORDER — TRAMADOL HCL 50 MG PO TABS: 50.0000 mg | ORAL_TABLET | Freq: Three times a day (TID) | ORAL | Status: DC | PRN

## 2016-02-23 NOTE — Telephone Encounter (Signed)
Medication filled to pharmacy as requested.   

## 2016-02-23 NOTE — Telephone Encounter (Signed)
Patient notified of PCP recommendations and is agreement and expresses an understanding.  Will call if pain does not get better before weekend.

## 2016-02-23 NOTE — Telephone Encounter (Signed)
Pt states that she is still having leg pains from blood clots and did not know if she show come in or not

## 2016-02-23 NOTE — Telephone Encounter (Signed)
[place] for Tramadol 50mg  TID prn, #30.  The leg pain w/ DVT is not uncommon.  The pain in her hip may be b/c she's changing the way she walks.  If the pain doesn't improve w/ Tramadol, I need to see her before the weekend

## 2016-02-23 NOTE — Telephone Encounter (Signed)
Spoke with patient and she stated that she is having leg pain in the same spot as previously, she is also having pain in her hip. Pt states that this pain is worsening the past 2 days.

## 2016-02-29 ENCOUNTER — Ambulatory Visit (INDEPENDENT_AMBULATORY_CARE_PROVIDER_SITE_OTHER): Payer: BLUE CROSS/BLUE SHIELD | Admitting: Internal Medicine

## 2016-02-29 ENCOUNTER — Encounter: Payer: Self-pay | Admitting: Internal Medicine

## 2016-02-29 VITALS — BP 120/80 | HR 78 | Ht 70.0 in | Wt 217.8 lb

## 2016-02-29 DIAGNOSIS — I2699 Other pulmonary embolism without acute cor pulmonale: Secondary | ICD-10-CM | POA: Diagnosis not present

## 2016-02-29 NOTE — Patient Instructions (Signed)
To get the most out of exercise, you need to be continuously aware that you are short of breath, but never out of breath, for 30 minutes daily. As you improve, it will actually be easier for you to do the same amount of exercise  in  30 minutes so always push to the level where you are short of breath.   If not making slow / steady progress with breathing over the next several months, call me to arrange a V/Q scan   Elastic hose may help prevent pooling of blood in lungs   Please schedule a follow up visit in 6  months but call sooner if needed to see Dr Elsworth Soho

## 2016-02-29 NOTE — Progress Notes (Signed)
Subjective:    Patient ID: Gail Foster, female    DOB: [birth year],    MRN: [id number]  HPI  34 yowf on BCP's never smoker s/p ovarian surgery Nov 24 2015 with L leg pain x 2 weeks > admit with dx dvt/pe   Admit date: 02/03/2016 Discharge date: 02/06/2016  Admission Diagnoses: PE  Discharge Diagnoses:  Principal Problem:  PE (pulmonary embolism) Active Problems:  DVT (deep venous thrombosis)L calf  Hospital Course: Pulmonary Embolism Heparin gtt initially as patient not a candidate for invasive procedure. Patient has been transitioned to Lovenox and did well. Discussed options of Coumadin versus other oral medications for anticoagulation for the next 6 to my months. Pt then decided on xarelto. 2d echo today-showed no right heart strain BLE [place] to look for source DVT-right lower extremity clean Stopped estrogen containing OCP, discussed this with patient as well Pt to discuss hypercoag w/u on d/c as op with provider  Consults: Pulm/CC  [place]  Significant Diagnostic Studies:   TTE Study Conclusions  - Left ventricle: The cavity size was normal. Systolic function was  normal. The estimated ejection fraction was in the range of 55%  to 60%. Images were inadequate for LV wall motion assessment.  Left ventricular diastolic function parameters were normal.   RV ok    02/29/2016 1st [facility] office visit/ Gail Foster  On Xarelto 20 mg daily  Chief Complaint  Patient presents with  . Pulmonary Consult    Referred by Dr. Leretha Foster. Pt dxed with PE and DVT 02/03/16- her breathing has recently improved, but not back to normal baseline.   gradually improving activity tol and would like to start doing aerobics but has not done much since Ovarian surgery 11/24/15 / mild residual L calf pain/ doe = MMRC1 = can walk nl pace, flat grade, can't hurry or go uphills or steps s sob   No obvious   day to day or daytime variabilty or assoc  chronic cough or cp or chest tightness, subjective wheeze overt sinus or hb symptoms. No unusual exp hx or h/o childhood pna/ asthma or knowledge of premature birth.  Sleeping ok without nocturnal  or early am exacerbation  of respiratory  c/o's or need for noct saba. Also denies any obvious fluctuation of symptoms with weather or environmental changes or other aggravating or alleviating factors except as outlined above   Current Medications, Allergies, Complete Past Medical History, Past Surgical History, Family History, and Social History were reviewed in [employer] record.               Review of Systems  Constitutional: Negative for fever, chills and unexpected weight change.  HENT: Negative for congestion, dental problem, ear pain, nosebleeds, postnasal drip, rhinorrhea, sinus pressure, sneezing, sore throat, trouble swallowing and voice change.   Eyes: Negative for visual disturbance.  Respiratory: Positive for shortness of breath. Negative for cough and choking.   Cardiovascular: Negative for chest pain and leg swelling.  Gastrointestinal: Negative for vomiting, abdominal pain and diarrhea.  Genitourinary: Negative for difficulty urinating.  Musculoskeletal: Negative for arthralgias.  Skin: Negative for rash.  Neurological: Negative for tremors, syncope and headaches.  Hematological: Does not bruise/bleed easily.       Objective:   Physical Exam  amb wf nad   Wt Readings from Last 3 Encounters:  02/29/16 217 lb 12.8 oz (98.793 kg)  02/09/16 209 lb 6 oz (94.972 kg)  02/06/16 212 lb 8.4 oz (96.4 kg)  Vital signs reviewed   HEENT: nl dentition, turbinates, and oropharynx. Nl external ear canals without cough reflex   NECK :  without JVD/Nodes/TM/ nl carotid upstrokes bilaterally   LUNGS: no acc muscle use,  Nl contour chest which is clear to A and P bilaterally without cough on insp or exp maneuvers   CV:  RRR  no s3 or murmur or increase  in P2, no edema   ABD:  soft and nontender with nl inspiratory excursion in the supine position. No bruits or organomegaly, bowel sounds nl  MS:  Nl gait/ ext warm without deformities, calf tenderness, cyanosis or clubbing No obvious joint restrictions   SKIN: warm and dry without lesions    NEURO:  alert, approp, nl sensorium with  no motor deficits       I personally reviewed images and agree with radiology impression as follows:  CTa 02/03/16: Extensive pulmonary embolism bilaterally, more pronounced on the right than on the left. Evidence of right heart strain. Positive for acute PE with CT evidence of right heart strain (RV/LV Ratio = 1.0) consistent with at least submassive (intermediate risk) PE. The presence of right heart strain has been associated with an increased risk of morbidity and mortality. Please activate Code PE by paging [phone].  No parenchymal lung edema or consolidation. No adenopathy.      Assessment & Plan:

## 2016-03-01 ENCOUNTER — Encounter: Payer: Self-pay | Admitting: Internal Medicine

## 2016-03-01 NOTE — Assessment & Plan Note (Signed)
See CTa 02/03/16 with ? RH Strain > Echo 02/04/16 ok RV post op while on BCP's - Venous dopplers 02/03/16 > Calf Veins: Thrombus is noted within peroneal vein extending into the tibioperoneal trunk - Rec 6 months xarelto then re-eval Dr Elsworth Soho   I had an extended discussion with the patient reviewing all relevant studies completed to date and  lasting 25 minutes of a 40  minute transition of care/ initial outpt visit    1) reassured her that she is on adequate rx and no evidence at all for new clots but it takes weeks to months to completely dissolve them all and no new ones are likely to develop while on xarelto  2) in this setting it is fine to gradually build up aerobic activity but avoid any heavy straining in favor of low intensity aerobic work out like treadmill/ eliptical/biking and certainly to contact sports  3) re-visit longterm anticoagulation issues in 6 months Dr Elsworth Soho with repeat venous dopplers but no further lung/ heart studies unless not back to full aerobics and then v/q might be worth doing  4) note no longer on estrogen containing bcps   5) Each maintenance medication was reviewed in detail including most importantly the difference between maintenance and prns and under what circumstances the prns are to be triggered using an action plan format that is not reflected in the computer generated alphabetically organized AVS.    Please see instructions for details which were reviewed in writing and the patient given a copy highlighting the part that I personally wrote and discussed at today's ov.

## 2016-06-06 ENCOUNTER — Encounter: Payer: BLUE CROSS/BLUE SHIELD | Attending: Obstetrics and Gynecology | Admitting: Dietician

## 2016-06-06 ENCOUNTER — Encounter: Payer: Self-pay | Admitting: Dietician

## 2016-06-06 DIAGNOSIS — E669 Obesity, unspecified: Secondary | ICD-10-CM

## 2016-06-06 DIAGNOSIS — Z713 Dietary counseling and surveillance: Secondary | ICD-10-CM | POA: Insufficient documentation

## 2016-06-06 NOTE — Patient Instructions (Signed)
Choose wisely, eat slowly, stop when you are satisfied.    Avoid skipping meals. Keep and active lifestyle. Increased plant foods (non starchy vegetables) which are more anti-   inflammatory.

## 2016-06-06 NOTE — Progress Notes (Signed)
Medical Nutrition Therapy:  Appt start time: [id number] end time:  [id number].   Assessment:  Primary concerns today: Patient is here alone.  She has been trying to lose but has been unable to even though she is doing the same things now as she has in the past and has been able to lose in the past.  She follows "clean eating, paleoish diet habits"  She tracks what she eats in My Fitness Pal often.  Her thyroid panel was normal.   Weight hx: Lowest adult weight 165 lbs UBW:  170 lbs 2 years ago Highest adult weight 220 lbs in college and lost with diet modification/tracking and exercise. Today's weight 210 lbs. She gained weight about 1 year ago due to decreased activity, increased inflammation with a herniated/bulging disk in her back.  She was given oral steroids and physical therapy, referred to a pain doctor and received a steroid injection in her back one month ago.  This has significantly helped with pain and she has been able to lose 15 lbs in the past month.  Also, during this year she found an ovarian cyst the size of a grapefruit on her ovary, had this removed and then developed DVT's and PE's.  She is now on a blood thinner.   Patient lives with her boyfriend.  She does the shopping and cooking.  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  She has been traveling recently with work 1-2 weeks per month and meal choices become limited.  Preferred Learning Style:   No preference indicated   Learning Readiness:   Ready  MEDICATIONS: see list to include a supplement called Peacure which she states is a natural anti-inflammatory.   DIETARY INTAKE:  Usual eating pattern includes 3 meals and 2-3 snacks per day. Avoids fried foods, sugar.  24-hr recall:  B ( AM): Nutribullet smoothie (1 cup spinach, 1/2 banana, 1 cup strawberries, water, sometimes with Vega Protein powder) OR fruit OR Education officer, museum  Snk ( AM): egg  beaters and black coffee and occasional 100 calorie pack of almonds  L ( PM): grilled chicken (3.5-4 oz), steamed vegetables OR salad with protein OR wrap Snk ( PM): 100 calorie pack of almonds or tuna pouch pack D ( PM): grilled fish, chicken, or occasional steak, rice or sweet potatoes OR almond butter Snk ( PM): Halo ice cream if hasn't eaten enough or protein shake with almond butter Beverages: black coffee, water, diet soda or unsweetened tea  Usual physical activity: Crossfit 4-5 days per week, gym membership at office park, and walking.  Getting about 10,000 steps most days.  Estimated energy needs: 1600 calories 180 g carbohydrates 120 g protein 44 g fat  Progress Towards Goal(s):  In progress.   Nutritional Diagnosis:  NB-1.1 Food and nutrition-related knowledge deficit As related to weight loss.  As evidenced by patient report.    Intervention:  Nutrition counseling/education related to weight management.  Discussed foods and portion sizes, aspects of mindful eating, and tips when traveling.  Discussed increased intake of non starchy vegetables and continued active lifestyle.  Choose wisely, eat slowly, stop when you are satisfied.    Avoid skipping meals. Keep and active lifestyle. Increased plant foods (non starchy vegetables) which are more anti-   inflammatory.  Teaching Method Utilized:  Visual Auditory Hands on  Handouts given during visit include:  Meal plan card  Snack list  Barriers to learning/adherence to lifestyle change: none  Demonstrated degree of understanding via:  Teach Back   Monitoring/Evaluation:  Dietary intake, exercise, and body weight prn.

## 2016-06-07 ENCOUNTER — Encounter: Payer: Self-pay | Admitting: Dietician

## 2016-06-09 ENCOUNTER — Encounter: Payer: Self-pay | Admitting: Family Medicine

## 2016-06-09 ENCOUNTER — Ambulatory Visit (INDEPENDENT_AMBULATORY_CARE_PROVIDER_SITE_OTHER): Payer: BLUE CROSS/BLUE SHIELD | Admitting: Family Medicine

## 2016-06-09 VITALS — BP 108/68 | HR 94 | Temp 97.9°F | Ht 69.5 in | Wt 210.1 lb

## 2016-06-09 DIAGNOSIS — A6000 Herpesviral infection of urogenital system, unspecified: Secondary | ICD-10-CM

## 2016-06-09 NOTE — Progress Notes (Signed)
Pre visit review using our clinic review tool, if applicable. No additional management support is needed unless otherwise documented below in the visit note. 

## 2016-06-09 NOTE — Progress Notes (Signed)
HPI:  Acute visit for:  Genital lesions: -R labia and mons pubis -x 1 week -several lesions that start as small blister then turn to small ulcer, painful -on questioning admits to history of confirm genital herpes with outbreak in this area in the past and reports has episodic rx at home from her gynocologist that she can use; has not had outbreak in some time -no other lesions elsewhere -not pregnant -feels well otherwise with no rash elsewhere, discharge, fevers or malaise   ROS: See pertinent positives and negatives per HPI.  Past Medical History:  Diagnosis Date  . Anemia    history of  . Anxiety   . Chronic lower back pain   . DVT (deep venous thrombosis) ([facility]) 02/03/2016   LLE  . Leg cramping    LLE, "calf, behind knee" (02/03/2016)  . Lumbar herniated disc   . Pulmonary embolism ([facility]) 02/03/2016   bilateral PE, R > L    Past Surgical History:  Procedure Laterality Date  . DERMOID CYST  EXCISION Right 11/24/15  . LAPAROTOMY Right 11/24/2015   Procedure: LAPAROTOMY RIGHT OVARIAN CYSTECTOMY;  Surgeon: Marylynn Pearson, MD;  Location: [facility];  Service: Gynecology;  Laterality: Right;    Family History  Problem Relation Age of Onset  . Atrial fibrillation Father   . Hypertension Father   . Atrial fibrillation Paternal Grandmother   . Heart disease Paternal Grandmother     Social History   Social History  . Marital status: Single    Spouse name: N/A  . Number of children: N/A  . Years of education: N/A   Social History Main Topics  . Smoking status: Never Smoker  . Smokeless tobacco: Never Used  . Alcohol use 2.4 oz/week    4 Glasses of wine per week  . Drug use: No  . Sexual activity: Yes    Birth control/ protection: Pill   Other Topics Concern  . None   Social History Narrative  . None     Current Outpatient Prescriptions:  .  ALPRAZolam (XANAX) 0.5 MG tablet, Take 0.5 mg by mouth 2 (two) times daily as needed for anxiety. , Disp: , Rfl:  .   Multiple Vitamin (MULTIVITAMIN WITH MINERALS) TABS tablet, Take 1 tablet by mouth daily., Disp: , Rfl:  .  norethindrone (MICRONOR,CAMILA,ERRIN) 0.35 MG tablet, As directed daily, Disp: , Rfl:  .  propranolol (INDERAL) 20 MG tablet, Take 20 mg by mouth as needed. Reported on 02/09/2016, Disp: , Rfl:  .  rivaroxaban (XARELTO) 20 MG TABS tablet, Take 1 tablet (20 mg total) by mouth daily with supper., Disp: 128 tablet, Rfl: 0 .  traMADol (ULTRAM) 50 MG tablet, Take 1 tablet (50 mg total) by mouth every 8 (eight) hours as needed., Disp: 30 tablet, Rfl: 0  EXAM:  Vitals:   06/09/16 1347  BP: 108/68  Pulse: 94  Temp: 97.9 F (36.6 C)    Body mass index is 30.58 kg/m.  GENERAL: vitals reviewed and listed above, alert, oriented, appears well hydrated and in no acute distress  HEENT: atraumatic, conjunttiva clear, no obvious abnormalities on inspection of external nose and ears  NECK: no obvious masses on inspection  SKIN: several small erythematous superficial ulcers R labia majora and mons pubis region  MS: moves all extremities without noticeable abnormality  PSYCH: pleasant and cooperative, no obvious depression or anxiety  ASSESSMENT AND PLAN:  Discussed the following assessment and plan:  Genital herpes  -likley genital herpes given hx and  exam -discussed transmission, episodic and suppressive therapy and offered rx - she declined as reports gynecologist has provided her rx to use as needed and she will call them if needed -advised PCP or gyn follow up if does not resolve with tx or new concerns arise.  Pt declined AVS. There are no Patient Instructions on file for this visit.  Colin Benton R., DO

## 2016-06-30 ENCOUNTER — Other Ambulatory Visit: Payer: Self-pay | Admitting: Family Medicine

## 2016-07-24 ENCOUNTER — Ambulatory Visit (INDEPENDENT_AMBULATORY_CARE_PROVIDER_SITE_OTHER): Payer: BLUE CROSS/BLUE SHIELD | Admitting: Internal Medicine

## 2016-07-24 ENCOUNTER — Encounter: Payer: Self-pay | Admitting: Internal Medicine

## 2016-07-24 VITALS — BP 120/80 | HR 65 | Ht 69.0 in | Wt 211.0 lb

## 2016-07-24 DIAGNOSIS — I2782 Chronic pulmonary embolism: Secondary | ICD-10-CM | POA: Diagnosis not present

## 2016-07-24 NOTE — Assessment & Plan Note (Signed)
See CTa 02/03/16 with ? RH Strain > Echo 02/04/16 ok RV / occurred post op while on BCP's - Venous dopplers 02/03/16 > Calf Veins: Thrombus is noted within peroneal vein extending into the tibioperoneal trunk  - repeat venous dopplers 07/24/2016 >>>   Since the RV was ok acutely and her ex tol is back to baseline there is no need to repeat scans or echo's but we do need to clarify the venous issue before considering d/c the xarelto.  She is quite active now and no longer on estrogen containing bcp's so if nl venous dopplers consider change to ASA 325 mg daily.  If still evidence of any clotting extend xarelto to one year rx  I had an extended discussion with the patient reviewing all relevant studies completed to date and  lasting 15 to 20 minutes of a 25 minute visit    Each maintenance medication was reviewed in detail including most importantly the difference between maintenance and prns and under what circumstances the prns are to be triggered using an action plan format that is not reflected in the computer generated alphabetically organized AVS.    Please see instructions for details which were reviewed in writing and the patient given a copy highlighting the part that I personally wrote and discussed at today's ov.

## 2016-07-24 NOTE — Patient Instructions (Addendum)
Please see patient coordinator before you leave today  to schedule venous dopplersand consider stopping your xarelto if there are now normal

## 2016-07-24 NOTE — Assessment & Plan Note (Signed)
See pe a/p

## 2016-07-24 NOTE — Progress Notes (Signed)
Subjective:    Patient ID: Gail Foster, female    DOB: 07-11-1986,    MRN: [id number]    Brief patient profile:  54  yowf on BCP's never smoker s/p ovarian surgery Nov 24 2015 with L leg pain x 2 weeks > admit with dx L dvt/pe   Admit date: 02/03/2016 Discharge date: 02/06/2016  Admission Diagnoses: PE  Discharge Diagnoses:  Principal Problem:  PE (pulmonary embolism) Active Problems:  DVT (deep venous thrombosis)L calf  Hospital Course: Pulmonary Embolism Heparin gtt initially as patient not a candidate for invasive procedure. Patient has been transitioned to Lovenox and did well. Discussed options of Coumadin versus other oral medications for anticoagulation for the next 6 to my months. Pt then decided on xarelto. 2d echo today-showed no right heart strain BLE [place] to look for source DVT-right lower extremity clean Stopped estrogen containing OCP, discussed this with patient as well Pt to discuss hypercoag w/u on d/c as op with provider  Consults: Pulm/CC  [place]  Significant Diagnostic Studies:   TTE Study Conclusions  - Left ventricle: The cavity size was normal. Systolic function was  normal. The estimated ejection fraction was in the range of 55%  to 60%. Images were inadequate for LV wall motion assessment.  Left ventricular diastolic function parameters were normal.   RV ok    02/29/2016 1st [facility] office visit/ Gail Foster  On Xarelto 20 mg daily  Chief Complaint  Patient presents with  . Pulmonary Consult    Referred by Dr. Leretha Pol. Pt dxed with PE and DVT 02/03/16- her breathing has recently improved, but not back to normal baseline.   gradually improving activity tol and would like to start doing aerobics but has not done much since Ovarian surgery 11/24/15 / mild residual L calf pain/ doe = MMRC1 = can walk nl pace, flat grade, can't hurry or go uphills or steps s sob  rec To get the most out of  exercise, you need to be continuously aware that you are short of breath, but never out of breath, for 30 minutes daily. As you improve, it will actually be easier for you to do the same amount of exercise  in  30 minutes so always push to the level where you are short of breath.  If not making slow / steady progress with breathing over the next several months, call me to arrange a V/Q scan  Elastic hose may help prevent pooling of blood in lungs  Please schedule a follow up visit in 6  months but call sooner if needed to see Dr Elsworth Soho     07/24/2016  f/u ov/Gail Foster re:  PE/ L DVT 6 m on rx with xarelto  Chief Complaint  Patient presents with  . Follow-up    Breathing is doing well.  No new co's today.    some intermittent pain in L calf but no swelling / seems to flare when sitting for long times  Not limited by breathing from desired activities      No obvious day to day or daytime variability or assoc chronic cough or cp or chest tightness, subjective wheeze or overt sinus or hb symptoms. No unusual exp hx or h/o childhood pna/ asthma or knowledge of premature birth.  Sleeping ok without nocturnal  or early am exacerbation  of respiratory  c/o's or need for noct saba. Also denies any obvious fluctuation of symptoms with weather or environmental changes or other aggravating or alleviating factors  except as outlined above   Current Medications, Allergies, Complete Past Medical History, Past Surgical History, Family History, and Social History were reviewed in [employer] record.  ROS  The following are not active complaints unless bolded sore throat, dysphagia, dental problems, itching, sneezing,  nasal congestion or excess/ purulent secretions, ear ache,   fever, chills, sweats, unintended wt loss, classically pleuritic or exertional cp, hemoptysis,  orthopnea pnd or leg swelling, presyncope, palpitations, abdominal pain, anorexia, nausea, vomiting, diarrhea  or change in  bowel or bladder habits, change in stools or urine, dysuria,hematuria,  rash, arthralgias, visual complaints, headache, numbness, weakness or ataxia or problems with walking or coordination,  change in mood/affect or memory.                  Objective:   Physical Exam  amb wf nad   07/24/2016     211   02/29/16 217 lb 12.8 oz (98.793 kg)  02/09/16 209 lb 6 oz (94.972 kg)  02/06/16 212 lb 8.4 oz (96.4 kg)    Vital signs reviewed   HEENT: nl dentition, turbinates, and oropharynx. Nl external ear canals without cough reflex   NECK :  without JVD/Nodes/TM/ nl carotid upstrokes bilaterally   LUNGS: no acc muscle use,  Nl contour chest which is clear to A and P bilaterally without cough on insp or exp maneuvers   CV:  RRR  no s3 or murmur or increase in P2, no edema   ABD:  soft and nontender with nl inspiratory excursion in the supine position. No bruits or organomegaly, bowel sounds nl  MS:  Nl gait/ ext warm without deformities, calf tenderness, cyanosis or clubbing No obvious joint restrictions   SKIN: warm and dry without lesions    NEURO:  alert, approp, nl sensorium with  no motor deficits       I personally reviewed images and agree with radiology impression as follows:  CTa 02/03/16: Extensive pulmonary embolism bilaterally, more pronounced on the right than on the left. Evidence of right heart strain. Positive for acute PE with CT evidence of right heart strain (RV/LV Ratio = 1.0) consistent with at least submassive (intermediate risk) PE. The presence of right heart strain has been associated with an increased risk of morbidity and mortality. Please activate Code PE by paging [phone].  No parenchymal lung edema or consolidation. No adenopathy.      Assessment & Plan:

## 2016-07-26 ENCOUNTER — Ambulatory Visit (HOSPITAL_COMMUNITY)
Admission: RE | Admit: 2016-07-26 | Discharge: 2016-07-26 | Disposition: A | Discharge status: Home / Self Care | Payer: BLUE CROSS/BLUE SHIELD | Source: Ambulatory Visit | Attending: Internal Medicine | Admitting: Internal Medicine

## 2016-07-26 DIAGNOSIS — I82542 Chronic embolism and thrombosis of left tibial vein: Secondary | ICD-10-CM | POA: Diagnosis not present

## 2016-07-26 DIAGNOSIS — I2782 Chronic pulmonary embolism: Secondary | ICD-10-CM | POA: Insufficient documentation

## 2016-07-28 ENCOUNTER — Encounter: Payer: Self-pay | Admitting: Family Medicine

## 2016-07-28 ENCOUNTER — Ambulatory Visit (INDEPENDENT_AMBULATORY_CARE_PROVIDER_SITE_OTHER): Payer: BLUE CROSS/BLUE SHIELD | Admitting: Family Medicine

## 2016-07-28 ENCOUNTER — Telehealth: Payer: Self-pay | Admitting: Internal Medicine

## 2016-07-28 VITALS — BP 120/80 | HR 72 | Temp 98.2°F | Resp 16 | Ht 69.0 in | Wt 210.1 lb

## 2016-07-28 DIAGNOSIS — Z23 Encounter for immunization: Secondary | ICD-10-CM

## 2016-07-28 DIAGNOSIS — Z0000 Encounter for general adult medical examination without abnormal findings: Secondary | ICD-10-CM | POA: Diagnosis not present

## 2016-07-28 LAB — BASIC METABOLIC PANEL
BUN: 13 mg/dL (ref 6–23)
CALCIUM: 10.2 mg/dL (ref 8.4–10.5)
CO2: 30 mEq/L (ref 19–32)
Chloride: 104 mEq/L (ref 96–112)
Creatinine, Ser: 0.7 mg/dL (ref 0.40–1.20)
GFR: 103.64 mL/min (ref >60.00)
Glucose, Bld: 88 mg/dL (ref 70–99)
POTASSIUM: 5.1 meq/L (ref 3.5–5.1)
SODIUM: 140 meq/L (ref 135–145)

## 2016-07-28 LAB — CBC WITH DIFFERENTIAL/PLATELET
Basophils Absolute: 0 10*3/uL (ref 0.0–0.1)
Basophils Relative: 0.4 % (ref 0.0–3.0)
EOS PCT: 3.7 % (ref 0.0–5.0)
Eosinophils Absolute: 0.3 10*3/uL (ref 0.0–0.7)
HEMATOCRIT: 41.2 % (ref 36.0–46.0)
HEMOGLOBIN: 14.1 g/dL (ref 12.0–15.0)
LYMPHS ABS: 2 10*3/uL (ref 0.7–4.0)
LYMPHS PCT: 26.7 % (ref 12.0–46.0)
MCHC: 34.2 g/dL (ref 30.0–36.0)
MCV: 94.1 fl (ref 78.0–100.0)
MONOS PCT: 8.8 % (ref 3.0–12.0)
Monocytes Absolute: 0.6 10*3/uL (ref 0.1–1.0)
Neutro Abs: 4.4 10*3/uL (ref 1.4–7.7)
Neutrophils Relative %: 60.4 % (ref 43.0–77.0)
Platelets: 256 10*3/uL (ref 150.0–400.0)
RBC: 4.38 Mil/uL (ref 3.87–5.11)
RDW: 13 % (ref 11.5–15.5)
WBC: 7.3 10*3/uL (ref 4.0–10.5)

## 2016-07-28 LAB — HEPATIC FUNCTION PANEL
ALBUMIN: 4.6 g/dL (ref 3.5–5.2)
ALK PHOS: 85 U/L (ref 39–117)
ALT: 25 U/L (ref 0–35)
AST: 32 U/L (ref 0–37)
Bilirubin, Direct: 0.1 mg/dL (ref 0.0–0.3)
Total Bilirubin: 0.4 mg/dL (ref 0.2–1.2)
Total Protein: 7 g/dL (ref 6.0–8.3)

## 2016-07-28 LAB — LIPID PANEL
CHOL/HDL RATIO: 3
Cholesterol: 202 mg/dL — ABNORMAL HIGH (ref 0–200)
HDL: 58.6 mg/dL (ref >39.00)
LDL CALC: 134 mg/dL — AB (ref 0–99)
NonHDL: 143.35
Triglycerides: 49 mg/dL (ref 0.0–149.0)
VLDL: 9.8 mg/dL (ref 0.0–40.0)

## 2016-07-28 NOTE — Progress Notes (Signed)
   Subjective:    Patient ID: Gail Foster, female    DOB: Apr 23, 1986, 30 y.o.   MRN: [id number]  HPI CPE- UTD on pap.  No concerns today.  Due for Tdap.   Review of Systems Patient reports no vision/ hearing changes, adenopathy,fever, weight change,  persistant/recurrent hoarseness , swallowing issues, chest pain, palpitations, edema, persistant/recurrent cough, hemoptysis, dyspnea (rest/exertional/paroxysmal nocturnal), gastrointestinal bleeding (melena, rectal bleeding), abdominal pain, significant heartburn, bowel changes, GU symptoms (dysuria, hematuria, incontinence), Gyn symptoms (abnormal  bleeding, pain),  syncope, focal weakness, memory loss, numbness & tingling, skin/hair/nail changes, abnormal bruising or bleeding, anxiety, or depression.     Objective:   Physical Exam General Appearance:    Alert, cooperative, no distress, appears stated age  Head:    Normocephalic, without obvious abnormality, atraumatic  Eyes:    PERRL, conjunctiva/corneas clear, EOM's intact, fundi    benign, both eyes  Ears:    Normal TM's and external ear canals, both ears  Nose:   Nares normal, septum midline, mucosa normal, no drainage    or sinus tenderness  Throat:   Lips, mucosa, and tongue normal; teeth and gums normal  Neck:   Supple, symmetrical, trachea midline, no adenopathy;    Thyroid: no enlargement/tenderness/nodules  Back:     Symmetric, no curvature, ROM normal, no CVA tenderness  Lungs:     Clear to auscultation bilaterally, respirations unlabored  Chest Wall:    No tenderness or deformity   Heart:    Regular rate and rhythm, S1 and S2 normal, no murmur, rub   or gallop  Breast Exam:    Deferred to GYN  Abdomen:     Soft, non-tender, bowel sounds active all four quadrants,    no masses, no organomegaly  Genitalia:    Deferred to GYN  Rectal:    Extremities:   Extremities normal, atraumatic, no cyanosis or edema  Pulses:   2+ and symmetric all extremities  Skin:   Skin color,  texture, turgor normal, no rashes or lesions  Lymph nodes:   Cervical, supraclavicular, and axillary nodes normal  Neurologic:   CNII-XII intact, normal strength, sensation and reflexes    throughout          Assessment & Plan:  PE- pt's PE WNL w/ exception of being overweight.  UTD on pap.  Due for Tdap.  Check labs.  Anticipatory guidance provided.

## 2016-07-28 NOTE — Telephone Encounter (Signed)
Called and spoke with pt and she is aware of MW recs.  She stated that she will wait to see Dr. Kellie Simmering if needed after the next scan.

## 2016-07-28 NOTE — Addendum Note (Signed)
Addended by: Davis Gourd on: 07/28/2016 09:33 AM   Modules accepted: Orders

## 2016-07-28 NOTE — Progress Notes (Signed)
LMTCB

## 2016-07-28 NOTE — Progress Notes (Signed)
Pre visit review using our clinic review tool, if applicable. No additional management support is needed unless otherwise documented below in the visit note. 

## 2016-07-28 NOTE — Patient Instructions (Signed)
Follow up in 1 year or as needed We'll notify you of your lab results and make any changes if needed Continue to work on healthy diet and regular exercise- you look great! You are now up to date on your tetanus until age 30!!! Continue the Xarelto for another 3-6 months Call with any questions or concerns Happy Halloween!!!

## 2016-07-31 LAB — VITAMIN D 25 HYDROXY (VIT D DEFICIENCY, FRACTURES): VITD: 33.13 ng/mL (ref 30.00–100.00)

## 2016-07-31 LAB — TSH: TSH: 0.86 u[IU]/mL (ref 0.35–4.50)

## 2016-08-02 ENCOUNTER — Encounter: Payer: Self-pay | Admitting: General Practice

## 2016-08-29 ENCOUNTER — Encounter (HOSPITAL_BASED_OUTPATIENT_CLINIC_OR_DEPARTMENT_OTHER): Payer: Self-pay | Admitting: *Deleted

## 2016-08-29 ENCOUNTER — Telehealth: Payer: Self-pay | Admitting: Family Medicine

## 2016-08-29 ENCOUNTER — Emergency Department (HOSPITAL_BASED_OUTPATIENT_CLINIC_OR_DEPARTMENT_OTHER): Payer: BLUE CROSS/BLUE SHIELD

## 2016-08-29 ENCOUNTER — Emergency Department (HOSPITAL_BASED_OUTPATIENT_CLINIC_OR_DEPARTMENT_OTHER)
Admission: EM | Admit: 2016-08-29 | Discharge: 2016-08-29 | Disposition: A | Discharge status: Home / Self Care | Payer: BLUE CROSS/BLUE SHIELD | Attending: Emergency Medicine | Admitting: Emergency Medicine

## 2016-08-29 DIAGNOSIS — R079 Chest pain, unspecified: Secondary | ICD-10-CM

## 2016-08-29 DIAGNOSIS — R0602 Shortness of breath: Secondary | ICD-10-CM | POA: Diagnosis not present

## 2016-08-29 DIAGNOSIS — R0600 Dyspnea, unspecified: Secondary | ICD-10-CM | POA: Diagnosis present

## 2016-08-29 LAB — COMPREHENSIVE METABOLIC PANEL
ALT: 25 U/L (ref 14–54)
AST: 26 U/L (ref 15–41)
Albumin: 4.5 g/dL (ref 3.5–5.0)
Alkaline Phosphatase: 74 U/L (ref 38–126)
Anion gap: 8 (ref 5–15)
BILIRUBIN TOTAL: 0.3 mg/dL (ref 0.3–1.2)
BUN: 11 mg/dL (ref 6–20)
CHLORIDE: 105 mmol/L (ref 101–111)
CO2: 26 mmol/L (ref 22–32)
Calcium: 9.7 mg/dL (ref 8.9–10.3)
Creatinine, Ser: 0.87 mg/dL (ref 0.44–1.00)
GFR calc Af Amer: >60 mL/min (ref >60)
GFR calc non Af Amer: >60 mL/min (ref >60)
GLUCOSE: 98 mg/dL (ref 65–99)
POTASSIUM: 4.1 mmol/L (ref 3.5–5.1)
SODIUM: 139 mmol/L (ref 135–145)
Total Protein: 7.1 g/dL (ref 6.5–8.1)

## 2016-08-29 LAB — CBC WITH DIFFERENTIAL/PLATELET
Basophils Absolute: 0 10*3/uL (ref 0.0–0.1)
Basophils Relative: 0 %
EOS PCT: 2 %
Eosinophils Absolute: 0.1 10*3/uL (ref 0.0–0.7)
HEMATOCRIT: 39.1 % (ref 36.0–46.0)
Hemoglobin: 13.7 g/dL (ref 12.0–15.0)
LYMPHS ABS: 2.3 10*3/uL (ref 0.7–4.0)
LYMPHS PCT: 28 %
MCH: 32.8 pg (ref 26.0–34.0)
MCHC: 35 g/dL (ref 30.0–36.0)
MCV: 93.5 fL (ref 78.0–100.0)
MONO ABS: 0.4 10*3/uL (ref 0.1–1.0)
MONOS PCT: 5 %
NEUTROS ABS: 5.1 10*3/uL (ref 1.7–7.7)
Neutrophils Relative %: 65 %
PLATELETS: 276 10*3/uL (ref 150–400)
RBC: 4.18 MIL/uL (ref 3.87–5.11)
RDW: 11.8 % (ref 11.5–15.5)
WBC: 7.9 10*3/uL (ref 4.0–10.5)

## 2016-08-29 LAB — PREGNANCY, URINE: Preg Test, Ur: NEGATIVE

## 2016-08-29 MED ORDER — IOPAMIDOL (ISOVUE-370) INJECTION 76%
100.0000 mL | Freq: Once | INTRAVENOUS | Status: AC | PRN
  Administered 2016-08-29: 100 mL via INTRAVENOUS

## 2016-08-29 MED ORDER — RIVAROXABAN 20 MG PO TABS
20.0000 mg | ORAL_TABLET | Freq: Once | ORAL | Status: DC
  Filled 2016-08-29: qty 1

## 2016-08-29 MED ORDER — RIVAROXABAN 15 MG PO TABS
15.0000 mg | ORAL_TABLET | Freq: Once | ORAL | Status: AC
  Administered 2016-08-29: 15 mg via ORAL

## 2016-08-29 MED ORDER — RIVAROXABAN 15 MG PO TABS
ORAL_TABLET | ORAL | Status: AC
  Filled 2016-08-29: qty 1

## 2016-08-29 NOTE — ED Triage Notes (Signed)
Pt c/o SOB x 1 week , mid sternal cp x 1 hr HX PE and DVT

## 2016-08-29 NOTE — Telephone Encounter (Signed)
Patient Name: Gail Foster DOB: 08-Mar-1986 Initial Comment Caller is having shortness of breath and chest pain. Nurse Assessment Nurse: Vallery Sa, RN, Cathy Date/Time (Eastern Time): 08/29/2016 2:20:44 PM Confirm and document reason for call. If symptomatic, describe symptoms. You must click the next button to save text entered. ---Caller states she has had shortness of breath the past week and chest pain today (pain rated as a 7 on the 1 to 10 scale). No injury in the past 3 days. Alert and responsive. Does the patient have any new or worsening symptoms? ---Yes Will a triage be completed? ---Yes Related visit to physician within the last 2 weeks? ---No Does the PT have any chronic conditions? (i.e. diabetes, asthma, etc.) ---Yes List chronic conditions. ---DVT and PE about 6 months ago Is the patient pregnant or possibly pregnant? (Ask all females between the ages of 61-55) ---No Is this a behavioral health or substance abuse call? ---No Guidelines Guideline Title Affirmed Question Affirmed Notes Chest Pain [1] Chest pain lasts > 5 minutes AND [2] described as crushing, pressure-like, or heavy Final Disposition User Call EMS 911 Now [place], RN, Cathy Referrals GO TO FACILITY OTHER - SPECIFY Disagree/Comply: Disagree Disagree/Comply Reason: Disagree with instructions Damoni declined the Call 911 disposition and plans to have someone drive her to [facility] ER.

## 2016-08-29 NOTE — ED Notes (Signed)
Pt ambulatory to bathroom without assistance.

## 2016-08-29 NOTE — Discharge Instructions (Signed)

## 2016-08-29 NOTE — ED Provider Notes (Signed)
Emergency Department Provider Note   I have reviewed the triage vital signs and the nursing notes.   HISTORY  Chief Complaint Shortness of Breath   HPI Gail Foster is a 30 y.o. female with PMH of DVT/PE currently on Xarelto, anemia, and herniated disc presents to the emergency room in for evaluation of 1 week of progressively worsening exertional dyspnea and chest pain that started this morning. The patient was diagnosed with DVT and pulmonary embolism in May of this year. She is prescribed some relative but reports missing several doses over the holiday season as well as several doses prior to dyspnea onset. She states she had a recent lower extremity ultrasound that showed continued DVT. She reports pain is worse with exhalation and is located primarily over her right chest. No fevers, chills, or productive cough.   Past Medical History:  Diagnosis Date  . Anemia    history of  . Anxiety   . Chronic lower back pain   . DVT (deep venous thrombosis) ([place]) 02/03/2016   LLE  . Leg cramping    LLE, "calf, behind knee" (02/03/2016)  . Lumbar herniated disc   . Pulmonary embolism ([place]) 02/03/2016   bilateral PE, R > L    Patient Active Problem List   Diagnosis Date Noted  . Acute pulmonary embolism ([place]) 02/03/2016  . DVT (deep venous thrombosis) ([place])   . Dermoid 11/24/2015    Past Surgical History:  Procedure Laterality Date  . DERMOID CYST  EXCISION Right 11/24/15  . LAPAROTOMY Right 11/24/2015   Procedure: LAPAROTOMY RIGHT OVARIAN CYSTECTOMY;  Surgeon: Marylynn Pearson, MD;  Location: [facility];  Service: Gynecology;  Laterality: Right;    Current Outpatient Rx  . Order #: [id number] Class: Historical Med  . Order #: [id number] Class: Historical Med  . Order #: [id number] Class: Historical Med  . Order #: [id number] Class: Historical Med  . Order #: [id number] Class: Historical Med  . Order #: [id number] Class: Normal    Allergies Patient has no known allergies.  Family History    Problem Relation Age of Onset  . Atrial fibrillation Father   . Hypertension Father   . Atrial fibrillation Paternal Grandmother   . Heart disease Paternal Grandmother     Social History Social History  Substance Use Topics  . Smoking status: Never Smoker  . Smokeless tobacco: Never Used  . Alcohol use 2.4 oz/week    4 Glasses of wine per week    Review of Systems  Constitutional: No fever/chills Eyes: No visual changes. ENT: No sore throat. Cardiovascular: Positive chest pain. Respiratory: Positive shortness of breath. Gastrointestinal: No abdominal pain.  No nausea, no vomiting.  No diarrhea.  No constipation. Genitourinary: Negative for dysuria. Musculoskeletal: Negative for back pain. Skin: Negative for rash. Neurological: Negative for headaches, focal weakness or numbness.  10-point ROS otherwise negative.  ____________________________________________   PHYSICAL EXAM:  VITAL SIGNS: ED Triage Vitals  Enc Vitals Group     BP 08/29/16 1443 137/98     Pulse Rate 08/29/16 1443 85     Resp 08/29/16 1702 18     Temp 08/29/16 1443 98 F (36.7 C)     SpO2 08/29/16 1443 100 %     Weight 08/29/16 1441 212 lb (96.2 kg)     Height 08/29/16 1441 5' 9.5" (1.765 m)     Pain Score 08/29/16 1441 7    Constitutional: Alert and oriented. Well appearing and in no acute distress. Eyes: Conjunctivae are normal.  Head: Atraumatic. Nose:  No congestion/rhinnorhea. Mouth/Throat: Mucous membranes are moist.  Neck: No stridor.   Cardiovascular: Normal rate, regular rhythm. Good peripheral circulation. Grossly normal heart sounds.   Respiratory: Normal respiratory effort.  No retractions. Lungs CTAB. Gastrointestinal: Soft and nontender. No distention.  Musculoskeletal: No lower extremity tenderness nor edema. No gross deformities of extremities. Neurologic:  Normal speech and language. No gross focal neurologic deficits are appreciated.  Skin:  Skin is warm, dry and intact. No  rash noted.  ____________________________________________   LABS (all labs ordered are listed, but only abnormal results are displayed)  Labs Reviewed  COMPREHENSIVE METABOLIC PANEL  CBC WITH DIFFERENTIAL/PLATELET  PREGNANCY, URINE   ____________________________________________  EKG   EKG Interpretation  Date/Time:  Tuesday August 29 2016 14:46:44 EST Ventricular Rate:  79 PR Interval:  140 QRS Duration: 92 QT Interval:  404 QTC Calculation: 463 R Axis:   72 Text Interpretation:  Normal sinus rhythm Nonspecific ST abnormality Abnormal ECG No STEMI Confirmed by LONG MD, JOSHUA [phone]) on 08/29/2016 5:07:03 PM       ____________________________________________  RADIOLOGY  Ct Angio Chest Pe W And/or Wo Contrast  Result Date: 08/29/2016 CLINICAL DATA:  30 year old female with shortness of breath. DVT with pulmonary emboli in May 2017. Initial encounter. EXAM: CT ANGIOGRAPHY CHEST WITH CONTRAST TECHNIQUE: Multidetector CT imaging of the chest was performed using the standard protocol during bolus administration of intravenous contrast. Multiplanar CT image reconstructions and MIPs were obtained to evaluate the vascular anatomy. CONTRAST:  100 mL Isovue 370 COMPARISON:  CTA chest 02/03/2016. FINDINGS: Cardiovascular: Adequate contrast bolus timing in the pulmonary arterial tree. No residual pulmonary artery thrombus identified. Today there is no focal filling defect identified in the pulmonary arteries to suggest acute pulmonary embolism. No pericardial effusion. No calcified coronary artery atherosclerosis is evident. Negative visualized aorta. Mediastinum/Nodes: No lymphadenopathy. Lungs/Pleura: Major airways are patent. Both lungs are clear. No pleural effusion. Upper Abdomen: Negative visualized liver, gallbladder, spleen, pancreas, adrenal glands, kidneys, and bowel in the upper abdomen. Musculoskeletal: No osseous abnormality identified. Review of the MIP images confirms the  above findings. IMPRESSION: No new or residual pulmonary emboli identified. Negative CTA appearance of the chest. Electronically Signed   By: Genevie Ann M.D.   On: 08/29/2016 20:10    ____________________________________________   PROCEDURES  Procedure(s) performed:   Procedures  None ____________________________________________   INITIAL IMPRESSION / ASSESSMENT AND PLAN / ED COURSE  Pertinent labs & imaging results that were available during my care of the patient were reviewed by me and considered in my medical decision making (see chart for details).  Patient with past medical history of DVT/PE currently on Xarelto but missing some doses presents with progressively worsening dyspnea and chest pain that started today. Plan for labs including chemistry and pregnancy test. Given the patient's known history of current DVT she will require a CT scan of her chest to evaluate for acute PE. We will give the patient's dose of Xarelto here in the emergency department while awaiting scan.  CTA negative. Patient will follow with PCP in the coming days for continued outpatient w/u. Discussed results with her in detail.   At this time, I do not feel there is any life-threatening condition present. I have reviewed and discussed all results (EKG, imaging, lab, urine as appropriate), exam findings with patient. I have reviewed nursing notes and appropriate previous records.  I feel the patient is safe to be discharged home without further emergent workup. Discussed usual and customary return precautions. Patient  and family (if present) verbalize understanding and are comfortable with this plan.  Patient will follow-up with their primary care provider. If they do not have a primary care provider, information for follow-up has been provided to them. All questions have been answered.   Note: lab flooding at local facility significantly extended the patient's length of stay. Labs had to be sent to outside  lab. Kept patient informed of delay.  ____________________________________________  FINAL CLINICAL IMPRESSION(S) / ED DIAGNOSES  Final diagnoses:  Chest pain, unspecified type     MEDICATIONS GIVEN DURING THIS VISIT:  Medications  Rivaroxaban (XARELTO) tablet 15 mg (15 mg Oral Given 08/29/16 1712)  iopamidol (ISOVUE-370) 76 % injection 100 mL (100 mLs Intravenous Contrast Given 08/29/16 1955)     NEW OUTPATIENT MEDICATIONS STARTED DURING THIS VISIT:  None   Note:  This document was prepared using Dragon voice recognition software and may include unintentional dictation errors.  Nanda Quinton, MD Emergency Medicine   Margette Fast, MD 08/30/16 [phone]

## 2016-08-29 NOTE — ED Notes (Signed)
Pt returned from CT. C/o muscle cramping in foot. Pt given water per request.

## 2016-08-30 NOTE — Telephone Encounter (Signed)
Called patient regarding ED visit on 08/29/16 for SOB and chest pain. Patient states she had normal EKG and CXR that were performed at [facility]. CTA result: "No new or residual pulmonary emboli identified. Negative CTA appearance of the chest." Patient states her symptoms have improved some but occasionally will continue to have SOB and chest pain. Denies coughing or fever.  Patient declined f/u appt at this time, will call if appt needed. Advised patient to report to closest ED if symptoms get worse, patient verbalized understanding.

## 2016-12-06 ENCOUNTER — Other Ambulatory Visit: Payer: Self-pay | Admitting: Family Medicine

## 2017-01-03 ENCOUNTER — Ambulatory Visit (INDEPENDENT_AMBULATORY_CARE_PROVIDER_SITE_OTHER): Payer: BLUE CROSS/BLUE SHIELD | Admitting: Physician Assistant

## 2017-01-03 ENCOUNTER — Encounter: Payer: Self-pay | Admitting: Physician Assistant

## 2017-01-03 VITALS — BP 102/74 | HR 78 | Temp 98.7°F | Resp 14 | Ht 69.0 in | Wt 204.0 lb

## 2017-01-03 DIAGNOSIS — Q846 Other congenital malformations of nails: Secondary | ICD-10-CM | POA: Diagnosis not present

## 2017-01-03 NOTE — Progress Notes (Signed)
Patient presents to clinic today c/o 4 weeks of intermittent nail discoloration of left great toe. Notes this happens after using nail polish -- will note discoloration after couple of days of wear. Will remove polish and is left with a blackened area in the nail. Denies any trauma or injury to the nail or toe. Notes this area is painful until it grows out. Denies redness or swelling of nail bed. Denies noted lesions of other nails.   Past Medical History:  Diagnosis Date  . Anemia    history of  . Anxiety   . Chronic lower back pain   . DVT (deep venous thrombosis) ([place]) 02/03/2016   LLE  . Leg cramping    LLE, "calf, behind knee" (02/03/2016)  . Lumbar herniated disc   . Pulmonary embolism ([place]) 02/03/2016   bilateral PE, R > L    Current Outpatient Prescriptions on File Prior to Visit  Medication Sig Dispense Refill  . ALPRAZolam (XANAX) 0.5 MG tablet Take 0.5 mg by mouth 2 (two) times daily as needed for anxiety.     . Multiple Vitamin (MULTIVITAMIN WITH MINERALS) TABS tablet Take 1 tablet by mouth daily.    . norethindrone (MICRONOR,CAMILA,ERRIN) 0.35 MG tablet As directed daily    . propranolol (INDERAL) 20 MG tablet Take 20 mg by mouth as needed. Reported on 02/09/2016    . XARELTO 20 MG TABS tablet TAKE 1 TABLET BY MOUTH DAILY WITH SUPPER 128 tablet 0  . valACYclovir (VALTREX) 1000 MG tablet      No current facility-administered medications on file prior to visit.     No Known Allergies  Family History  Problem Relation Age of Onset  . Atrial fibrillation Father   . Hypertension Father   . Atrial fibrillation Paternal Grandmother   . Heart disease Paternal Grandmother     Social History   Social History  . Marital status: Single    Spouse name: N/A  . Number of children: N/A  . Years of education: N/A   Social History Main Topics  . Smoking status: Never Smoker  . Smokeless tobacco: Never Used  . Alcohol use 2.4 oz/week    4 Glasses of wine per week  . Drug  use: No  . Sexual activity: Yes    Birth control/ protection: Pill   Other Topics Concern  . None   Social History Narrative  . None   Review of Systems - See HPI.  All other ROS are negative.  BP 102/74   Pulse 78   Temp 98.7 F (37.1 C) (Oral)   Resp 14   Ht 5\' 9"  (1.753 m)   Wt 204 lb (92.5 kg)   SpO2 99%   BMI 30.13 kg/m   Physical Exam  Constitutional: She is oriented to person, place, and time and well-developed, well-nourished, and in no distress.  HENT:  Head: Normocephalic and atraumatic.  Eyes: Conjunctivae are normal.  Neck: Neck supple.  Cardiovascular: Normal rate, regular rhythm, normal heart sounds and intact distal pulses.   Pulmonary/Chest: Effort normal and breath sounds normal. No respiratory distress. She has no wheezes. She has no rales. She exhibits no tenderness.  Neurological: She is alert and oriented to person, place, and time.  Skin: Skin is warm and dry.     Vitals reviewed.    Assessment/Plan: 1. Abnormality of nail tissue Very odd presentation and history. Unclear etiology. Fungal versus other cause. Will refer to Podiatry for nail scraping and further management.   -  Ambulatory referral to Podiatry   Leeanne Rio, PA-C

## 2017-01-03 NOTE — Patient Instructions (Addendum)
Please keep nail clean and dry. Soak the nail in some hydrogen peroxide daily for about 5 minutes. Then pull skin away from the nail on effected side. I am setting you up with Podiatry for further assessment of this unusual nail finding.  No polish until we get this assessed. If you note any new symptoms, please let us know.

## 2017-01-03 NOTE — Progress Notes (Signed)
Pre visit review using our clinic review tool, if applicable. No additional management support is needed unless otherwise documented below in the visit note. 

## 2017-03-16 ENCOUNTER — Telehealth: Payer: Self-pay | Admitting: Family Medicine

## 2017-03-16 DIAGNOSIS — I2699 Other pulmonary embolism without acute cor pulmonale: Secondary | ICD-10-CM

## 2017-03-16 DIAGNOSIS — I82402 Acute embolism and thrombosis of unspecified deep veins of left lower extremity: Secondary | ICD-10-CM

## 2017-03-16 NOTE — Telephone Encounter (Signed)
Spoke with pt she does not have any pain in her leg anymore. She says that she thought that you might want to have another [place] to verify that the clot has gone away. She was advised in October to remain on the medication for another 3-6 months. Pt has enough medication to last until the end of the month.   Pt was advised that PCP was out of the office and I would let her know on Monday of PCP recommendation.

## 2017-03-16 NOTE — Telephone Encounter (Signed)
Pt calling asking if she needs to be reck for blood clots and to continue taking the Rx for blood thinner. Please advise

## 2017-03-19 NOTE — Telephone Encounter (Signed)
Pt informed and imaging order placed.

## 2017-03-19 NOTE — Telephone Encounter (Signed)
Pt needs to repeat L LE venous doppler prior to stopping Xarelto (based on Dr Gustavus Bryant note from October).  Ok to order venous doppler and then we can decide about medication

## 2017-03-28 ENCOUNTER — Ambulatory Visit
Admission: RE | Admit: 2017-03-28 | Discharge: 2017-03-28 | Disposition: A | Discharge status: Home / Self Care | Payer: BLUE CROSS/BLUE SHIELD | Source: Ambulatory Visit | Attending: Family Medicine | Admitting: Family Medicine

## 2017-03-28 DIAGNOSIS — I82402 Acute embolism and thrombosis of unspecified deep veins of left lower extremity: Secondary | ICD-10-CM

## 2017-03-28 DIAGNOSIS — I2699 Other pulmonary embolism without acute cor pulmonale: Secondary | ICD-10-CM

## 2017-05-25 ENCOUNTER — Ambulatory Visit (INDEPENDENT_AMBULATORY_CARE_PROVIDER_SITE_OTHER): Payer: 59 | Admitting: Family Medicine

## 2017-05-25 VITALS — BP 130/72 | HR 44 | Temp 98.4°F | Wt 200.6 lb

## 2017-05-25 DIAGNOSIS — L0390 Cellulitis, unspecified: Secondary | ICD-10-CM | POA: Diagnosis not present

## 2017-05-25 MED ORDER — DOXYCYCLINE HYCLATE 100 MG PO CAPS: 100.0000 mg | ORAL_CAPSULE | Freq: Two times a day (BID) | ORAL | 0 refills | Status: DC

## 2017-05-25 NOTE — Patient Instructions (Signed)

## 2017-05-25 NOTE — Progress Notes (Signed)
   Subjective:    Patient ID: Gail Foster, female    DOB: 1986-06-25, 31 y.o.   MRN: [id number]  HPI This is a 31 yo female who presents today with 4 days of irritated area upper left thigh. She has noticed increased redness, swelling and had some drainage yesterday which made it feel better. She has been applying teatree oil, apple cider vinegar and taking ibuprofen with some relief. Boyfriend with "staph infection" and is on oral antibiotics.  Patient's last menstrual period was 05/04/2017. She has not missed any doses of OCP.  Past Medical History:  Diagnosis Date  . Anemia    history of  . Anxiety   . Chronic lower back pain   . DVT (deep venous thrombosis) ([facility]) 02/03/2016   LLE  . Leg cramping    LLE, "calf, behind knee" (02/03/2016)  . Lumbar herniated disc   . Pulmonary embolism ([facility]) 02/03/2016   bilateral PE, R > L   Past Surgical History:  Procedure Laterality Date  . DERMOID CYST  EXCISION Right 11/24/15  . LAPAROTOMY Right 11/24/2015   Procedure: LAPAROTOMY RIGHT OVARIAN CYSTECTOMY;  Surgeon: Marylynn Pearson, MD;  Location: [facility];  Service: Gynecology;  Laterality: Right;   Family History  Problem Relation Age of Onset  . Atrial fibrillation Father   . Hypertension Father   . Atrial fibrillation Paternal Grandmother   . Heart disease Paternal Grandmother    Social History  Substance Use Topics  . Smoking status: Never Smoker  . Smokeless tobacco: Never Used  . Alcohol use 2.4 oz/week    4 Glasses of wine per week     Review of Systems  Constitutional: Negative for fever.  Skin:       Redness, swelling, drainage, pain left upper thigh.        Objective:   Physical Exam  Constitutional: She is oriented to person, place, and time. She appears well-developed and well-nourished. No distress.  HENT:  Head: Normocephalic and atraumatic.  Pulmonary/Chest: Effort normal.  Neurological: She is alert and oriented to person, place, and time.  Skin: Skin is warm and  dry. She is not diaphoretic.  Left upper inner thigh with approximately 8 cm area erythema with central pustule. Non fluctuant, mildly tender.   Psychiatric: She has a normal mood and affect. Her behavior is normal. Judgment and thought content normal.  Vitals reviewed.        BP 130/72 (BP Location: Right Arm, Patient Position: Sitting, Cuff Size: Normal)   Pulse (!) 44   Temp 98.4 F (36.9 C) (Oral)   Wt 200 lb 9.6 oz (91 kg)   LMP 05/04/2017   SpO2 98%   BMI 29.62 kg/m  Wt Readings from Last 3 Encounters:  05/25/17 200 lb 9.6 oz (91 kg)  01/03/17 204 lb (92.5 kg)  08/29/16 212 lb (96.2 kg)    Assessment & Plan:  1. Cellulitis, unspecified cellulitis site - Provided written and verbal information regarding diagnosis and treatment. - RTC/ED precautions reviewed - she was instructed to use warm compresses several times a day - doxycycline (VIBRAMYCIN) 100 MG capsule; Take 1 capsule (100 mg total) by mouth 2 (two) times daily.  Dispense: 14 capsule; Refill: 0- potential side effects including nausea/vomiting, increased sun sensitivity discussed with patient   Clarene Reamer, FNP-BC  [facility] [place], [place]  05/25/2017 9:53 AM

## 2017-05-29 ENCOUNTER — Encounter: Payer: Self-pay | Admitting: Family Medicine

## 2017-05-30 ENCOUNTER — Other Ambulatory Visit: Payer: Self-pay | Admitting: Family Medicine

## 2017-05-30 MED ORDER — FLUCONAZOLE 150 MG PO TABS: 150.0000 mg | ORAL_TABLET | Freq: Once | ORAL | 0 refills | Status: AC

## 2017-07-09 IMAGING — CT CT ANGIO CHEST
2 of 8 series · 18 of 36 positions shown · IV contrast (isovue)
Comparison: None.

CLINICAL DATA: Two day history of shortness of breath.  Anemia.

EXAM:
CT ANGIOGRAPHY CHEST WITH CONTRAST
TECHNIQUE: Multidetector CT imaging of the chest was performed using the
standard protocol during bolus administration of intravenous
contrast. Multiplanar CT image reconstructions and MIPs were
obtained to evaluate the vascular anatomy.
CONTRAST:  100 mL Isovue 370 nonionic

[Series 6: pe thins · axial · 0.55mm/px · z∈[-264,-5]mm · 17 of 291 slices shown]
[im 16/291  lung]
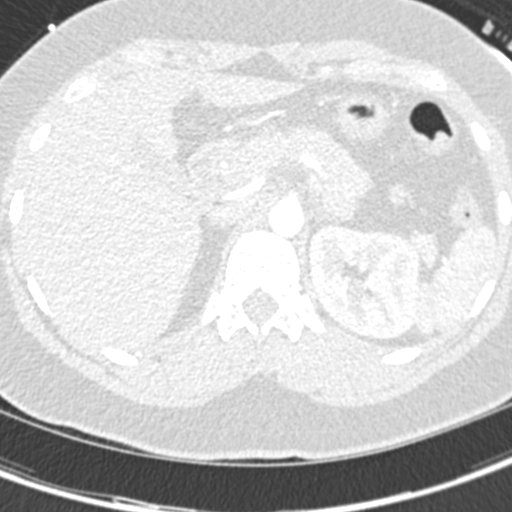
[im 31/291  mediastinal]
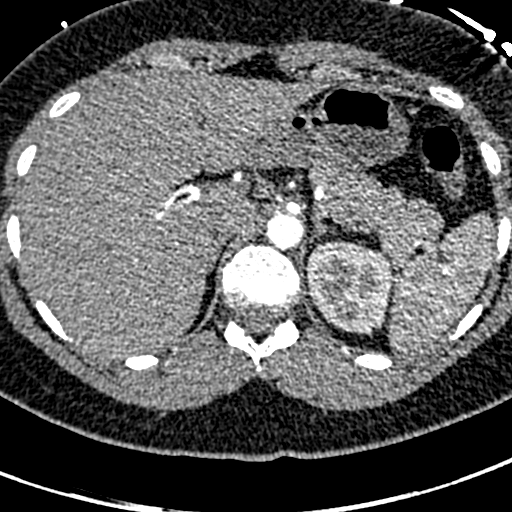
[im 46/291  lung]
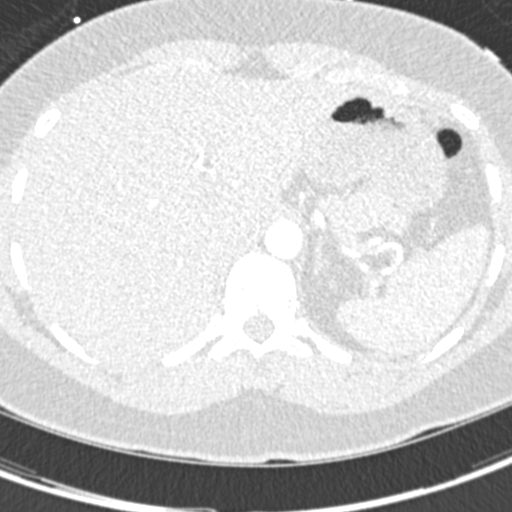
[im 62/291  mediastinal]
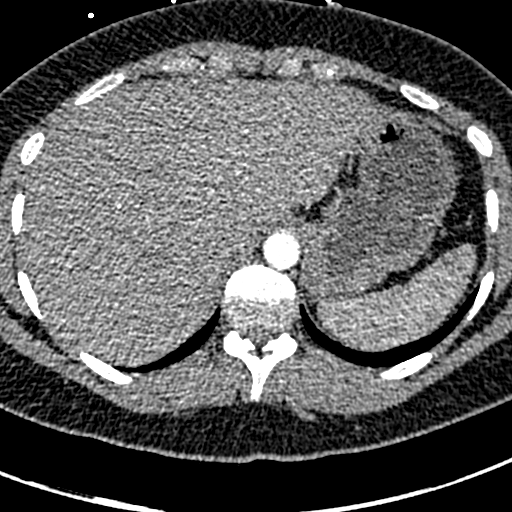
[im 77/291  lung]
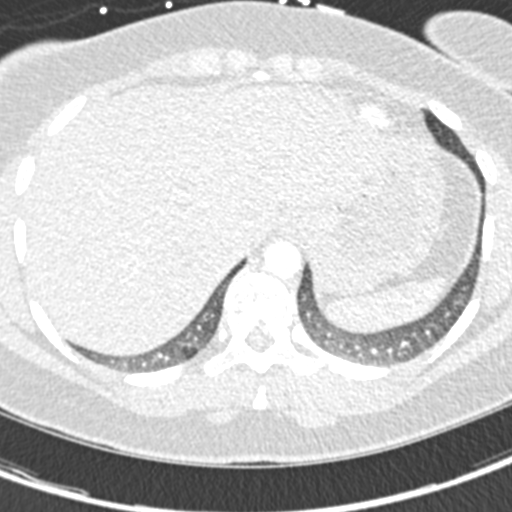
[im 92/291  mediastinal]
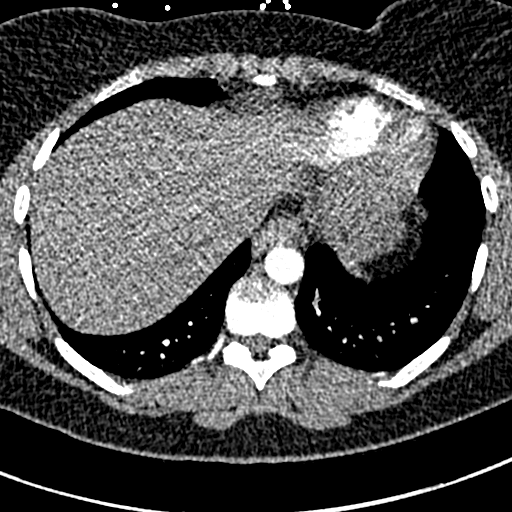
[im 107/291  lung]
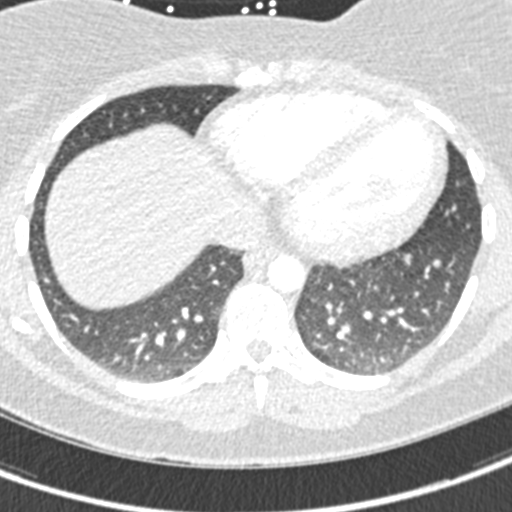
[im 123/291  mediastinal]
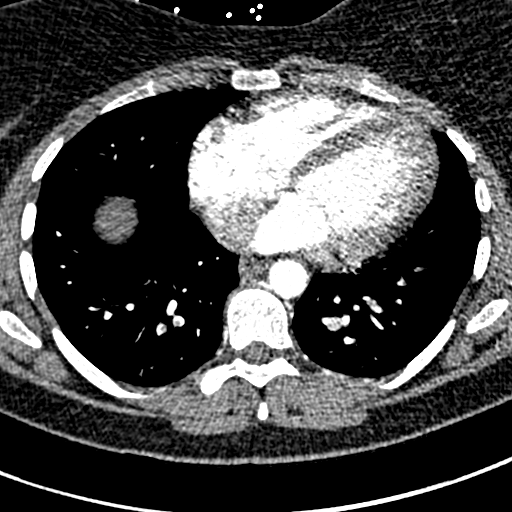
[im 153/291  lung]
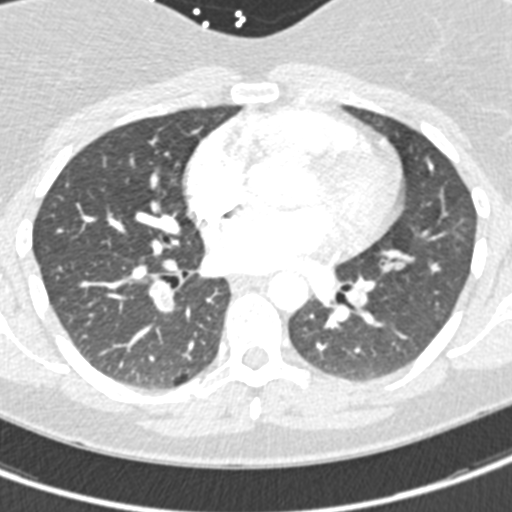
[im 168/291  mediastinal]
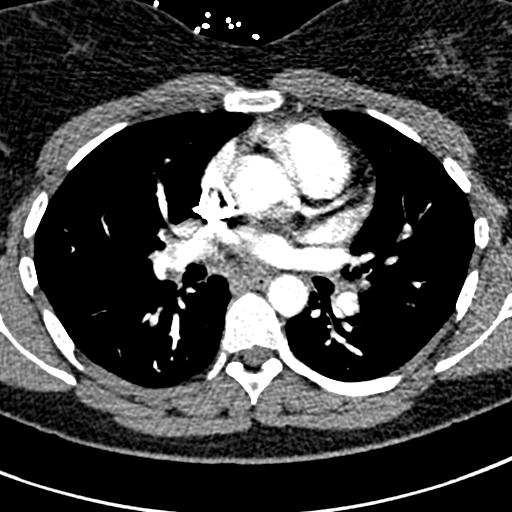
[im 184/291  lung]
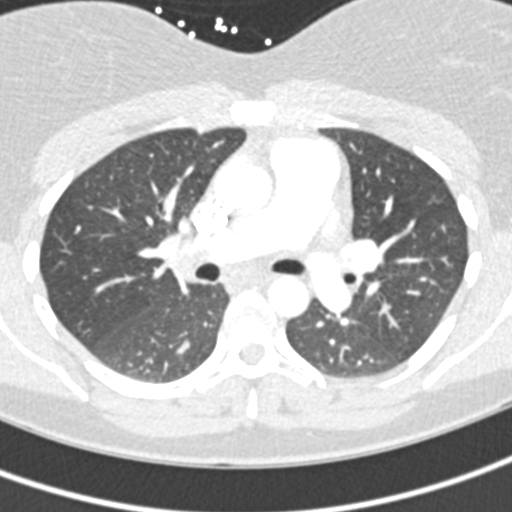
[im 199/291  mediastinal]
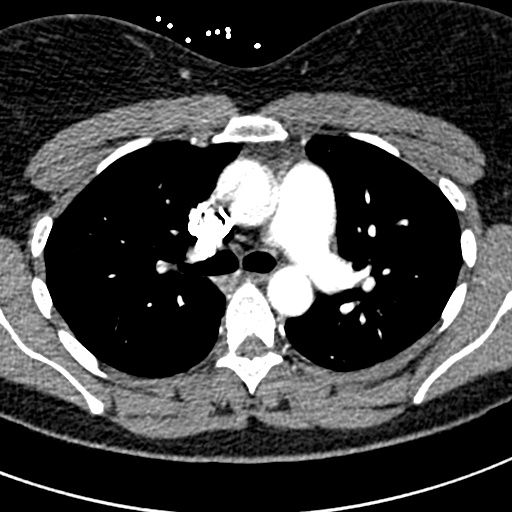
[im 214/291  lung]
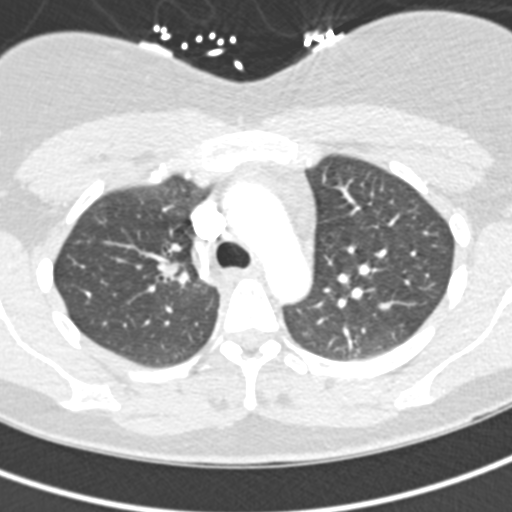
[im 229/291  mediastinal]
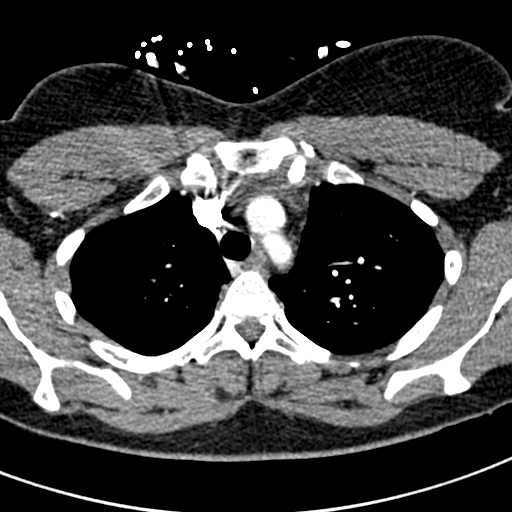
[im 245/291  lung]
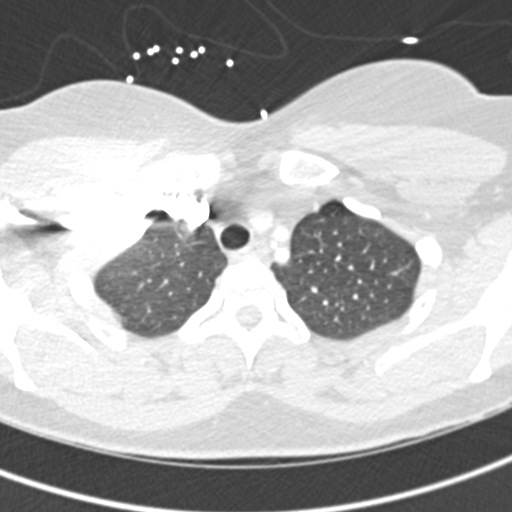
[im 260/291  mediastinal]
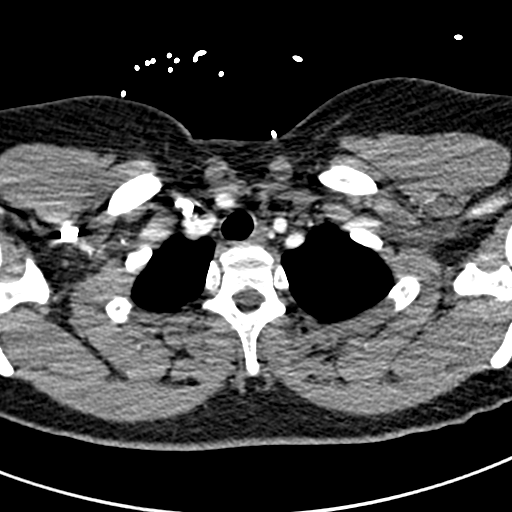
[im 275/291  lung]
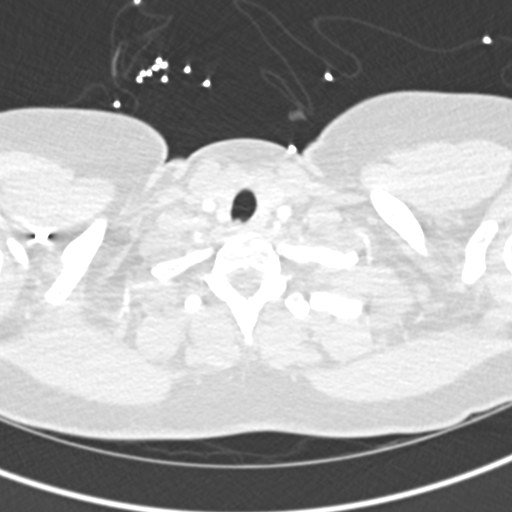

[Series 7: pe coronal mpr · coronal · 0.59mm/px · 1 of 137 slices shown]
[im 69/137  mediastinal]
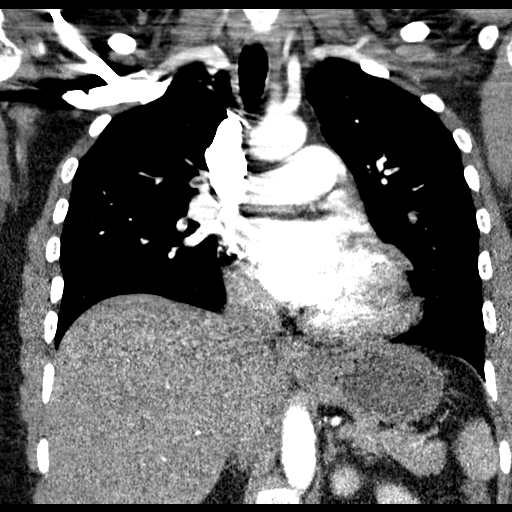

[18 of 36 positions shown; findings below may reference images not displayed]

FINDINGS: Mediastinum/Lymph Nodes: There is extensive pulmonary embolism
arising from the right mid main pulmonary artery with extension into
multiple right-sided upper and lower lobe pulmonary arterial
branches. There are multiple left lower lobe segmental pulmonary
emboli as well as a pulmonary embolus in the posterior segment left
upper lobe pulmonary artery branch. No pulmonary embolus is seen in
the left main pulmonary artery. The right ventricle to left
ventricle diameter ratio is 1.0, abnormal and consistent with a
degree of right heart strain.

There is no thoracic aortic aneurysm or dissection. Visualized great
vessels appear normal. Right and left common carotid arteries arise
as a common trunk, an anatomic variant. Pericardium is not
thickened.

Visualized thyroid appears normal. There is no demonstrable
adenopathy.

Lungs/Pleura: Lungs are clear except for occasional small bullae in
the lower lobes. No edema or consolidation. No demonstrable
pulmonary infarct. No pleural effusion.

Upper abdomen: Visualized upper abdominal structures appear
unremarkable.

Musculoskeletal: No blastic or lytic bone lesions.

Review of the MIP images confirms the above findings.
IMPRESSION: Extensive pulmonary embolism bilaterally, more pronounced on the
right than on the left. Evidence of right heart strain. Positive for
acute PE with CT evidence of right heart strain (RV/LV Ratio = 1.0)
consistent with at least submassive (intermediate risk) PE. The
presence of right heart strain has been associated with an increased
risk of morbidity and mortality. Please activate Code PE by paging
[phone].

No parenchymal lung edema or consolidation.  No adenopathy.

Critical Value/emergent results were called by telephone at the time
of interpretation on 02/03/2016 at [DATE] to Dr. Tmira Jadot, who
verbally acknowledged these results.

## 2017-07-30 ENCOUNTER — Encounter: Payer: Self-pay | Admitting: Family Medicine

## 2017-07-30 ENCOUNTER — Ambulatory Visit (INDEPENDENT_AMBULATORY_CARE_PROVIDER_SITE_OTHER): Payer: 59 | Admitting: Family Medicine

## 2017-07-30 DIAGNOSIS — Z0000 Encounter for general adult medical examination without abnormal findings: Secondary | ICD-10-CM | POA: Diagnosis not present

## 2017-07-30 DIAGNOSIS — E669 Obesity, unspecified: Secondary | ICD-10-CM

## 2017-07-30 DIAGNOSIS — R002 Palpitations: Secondary | ICD-10-CM | POA: Diagnosis not present

## 2017-07-30 DIAGNOSIS — E66811 Obesity, class 1: Secondary | ICD-10-CM

## 2017-07-30 LAB — BASIC METABOLIC PANEL
BUN: 14 mg/dL (ref 6–23)
CHLORIDE: 104 meq/L (ref 96–112)
CO2: 26 meq/L (ref 19–32)
Calcium: 9.4 mg/dL (ref 8.4–10.5)
Creatinine, Ser: 0.66 mg/dL (ref 0.40–1.20)
GFR: 110.21 mL/min (ref >60.00)
GLUCOSE: 98 mg/dL (ref 70–99)
POTASSIUM: 4.3 meq/L (ref 3.5–5.1)
Sodium: 138 mEq/L (ref 135–145)

## 2017-07-30 LAB — LIPID PANEL
CHOLESTEROL: 193 mg/dL (ref 0–200)
HDL: 59.9 mg/dL (ref >39.00)
LDL CALC: 119 mg/dL — AB (ref 0–99)
NonHDL: 132.84
Total CHOL/HDL Ratio: 3
Triglycerides: 71 mg/dL (ref 0.0–149.0)
VLDL: 14.2 mg/dL (ref 0.0–40.0)

## 2017-07-30 LAB — CBC WITH DIFFERENTIAL/PLATELET
BASOS ABS: 0 10*3/uL (ref 0.0–0.1)
Basophils Relative: 0.6 % (ref 0.0–3.0)
Eosinophils Absolute: 0.2 10*3/uL (ref 0.0–0.7)
Eosinophils Relative: 2.4 % (ref 0.0–5.0)
HEMATOCRIT: 40.2 % (ref 36.0–46.0)
Hemoglobin: 13.7 g/dL (ref 12.0–15.0)
Lymphocytes Relative: 23.8 % (ref 12.0–46.0)
Lymphs Abs: 1.5 10*3/uL (ref 0.7–4.0)
MCHC: 34.1 g/dL (ref 30.0–36.0)
MCV: 98 fl (ref 78.0–100.0)
MONO ABS: 0.5 10*3/uL (ref 0.1–1.0)
Monocytes Relative: 7.7 % (ref 3.0–12.0)
NEUTROS ABS: 4.1 10*3/uL (ref 1.4–7.7)
NEUTROS PCT: 65.5 % (ref 43.0–77.0)
PLATELETS: 233 10*3/uL (ref 150.0–400.0)
RBC: 4.1 Mil/uL (ref 3.87–5.11)
RDW: 12.6 % (ref 11.5–15.5)
WBC: 6.3 10*3/uL (ref 4.0–10.5)

## 2017-07-30 LAB — HEPATIC FUNCTION PANEL
ALBUMIN: 4.2 g/dL (ref 3.5–5.2)
ALK PHOS: 73 U/L (ref 39–117)
ALT: 21 U/L (ref 0–35)
AST: 27 U/L (ref 0–37)
Bilirubin, Direct: 0.1 mg/dL (ref 0.0–0.3)
TOTAL PROTEIN: 6.3 g/dL (ref 6.0–8.3)
Total Bilirubin: 0.8 mg/dL (ref 0.2–1.2)

## 2017-07-30 LAB — TSH: TSH: 1.31 u[IU]/mL (ref 0.35–4.50)

## 2017-07-30 NOTE — Assessment & Plan Note (Addendum)
New.  Pt's sxs were quite noticeable last month and were occurring at night 2-3x/week.  She reports heart rate will slow, the beat 'hard' and then be 'irregular'.  Check labs to r/o anemia, thyroid, electrolyte abnormality.  Get EKG (although pt is asymptomatic today)- normal w/ exception of PVCs.  Refer to cards.  Pt expressed understanding and is in agreement w/ plan.

## 2017-07-30 NOTE — Progress Notes (Signed)
   Subjective:    Patient ID: Gail Foster, female    DOB: June 18, 1986, 31 y.o.   MRN: [id number]  HPI CPE- UTD on GYN Julien Girt).  UTD on Tdap.  Declined flu shot.     Review of Systems Patient reports no vision/ hearing changes, adenopathy,fever, weight change,  persistant/recurrent hoarseness , swallowing issues, chest pain, edema, persistant/recurrent cough, hemoptysis, dyspnea (rest/exertional/paroxysmal nocturnal), gastrointestinal bleeding (melena, rectal bleeding), abdominal pain, significant heartburn, bowel changes, GU symptoms (dysuria, hematuria, incontinence), Gyn symptoms (abnormal  bleeding, pain),  syncope, focal weakness, memory loss, numbness & tingling, skin/hair/nail changes, abnormal bruising or bleeding, anxiety, or depression.   + palpitations- pt reports irregular heartbeat that occurs at night but not every night.  No obvious relation to caffeine.  No obvious triggers such as stress.  HR was in the 40s at previous doctor visit.  In September was having sxs 2-3x/week but here lately she has not noticed this being an issue.    Objective:   Physical Exam General Appearance:    Alert, cooperative, no distress, appears stated age  Head:    Normocephalic, without obvious abnormality, atraumatic  Eyes:    PERRL, conjunctiva/corneas clear, EOM's intact, fundi    benign, both eyes  Ears:    Normal TM's and external ear canals, both ears  Nose:   Nares normal, septum midline, mucosa normal, no drainage    or sinus tenderness  Throat:   Lips, mucosa, and tongue normal; teeth and gums normal  Neck:   Supple, symmetrical, trachea midline, no adenopathy;    Thyroid: no enlargement/tenderness/nodules  Back:     Symmetric, no curvature, ROM normal, no CVA tenderness  Lungs:     Clear to auscultation bilaterally, respirations unlabored  Chest Wall:    No tenderness or deformity   Heart:    Regular rate and rhythm, S1 and S2 normal, no murmur, rub   or gallop  Breast Exam:    Deferred  to GYN  Abdomen:     Soft, non-tender, bowel sounds active all four quadrants,    no masses, no organomegaly  Genitalia:    Deferred to GYN  Rectal:    Extremities:   Extremities normal, atraumatic, no cyanosis or edema  Pulses:   2+ and symmetric all extremities  Skin:   Skin color, texture, turgor normal, no rashes or lesions  Lymph nodes:   Cervical, supraclavicular, and axillary nodes normal  Neurologic:   CNII-XII intact, normal strength, sensation and reflexes    throughout          Assessment & Plan:

## 2017-07-30 NOTE — Assessment & Plan Note (Signed)
New.  Pt has gained 8 lbs, placing her BMI over 30.  Stressed need for healthy diet and regular exercise.  Check labs to risk stratify.  Will follow.

## 2017-07-30 NOTE — Patient Instructions (Signed)
Follow up in 6 months to recheck weight loss progress We'll notify you of your lab results and make any changes if needed Continue to work on healthy diet and regular exercise- you can do it! We'll call you with your cardiology appt for the palpitations Call and schedule your GYN appt Call with any questions or concerns Happy Fall!!

## 2017-07-30 NOTE — Assessment & Plan Note (Signed)
Pt's PE WNL w/ exception of obesity.  UTD on Tdap.  Pt to call GYN and schedule appt.  Declines flu shot.  Check labs.  Anticipatory guidance provided.

## 2017-08-03 ENCOUNTER — Ambulatory Visit (INDEPENDENT_AMBULATORY_CARE_PROVIDER_SITE_OTHER): Payer: 59 | Admitting: Physician Assistant

## 2017-08-03 ENCOUNTER — Encounter: Payer: Self-pay | Admitting: Physician Assistant

## 2017-08-03 VITALS — BP 120/72 | HR 78 | Temp 98.7°F | Resp 14 | Ht 69.0 in | Wt 209.0 lb

## 2017-08-03 DIAGNOSIS — L989 Disorder of the skin and subcutaneous tissue, unspecified: Secondary | ICD-10-CM | POA: Diagnosis not present

## 2017-08-03 MED ORDER — DOXYCYCLINE HYCLATE 100 MG PO CAPS: 100.0000 mg | ORAL_CAPSULE | Freq: Two times a day (BID) | ORAL | 0 refills | Status: DC

## 2017-08-03 NOTE — Progress Notes (Signed)
Patient presents to clinic today c/o tender spot of posterior L thigh first noted yesterday. Noted she had a bump and squeezed it, getting a little bit of pus out. No drainage since. Noted still some tenderness and redness at the site. Has history of cellulitis and wanted this checked out before anything worsened. Denies fever, chills, malaise/fatigue. Has not had to take anything for symptoms.   Past Medical History:  Diagnosis Date  . Anemia    history of  . Anxiety   . Chronic lower back pain   . DVT (deep venous thrombosis) ([facility]) 02/03/2016   LLE  . Leg cramping    LLE, "calf, behind knee" (02/03/2016)  . Lumbar herniated disc   . Pulmonary embolism ([facility]) 02/03/2016   bilateral PE, R > L    Current Outpatient Prescriptions on File Prior to Visit  Medication Sig Dispense Refill  . ALPRAZolam (XANAX) 0.5 MG tablet Take 0.5 mg by mouth 2 (two) times daily as needed for anxiety.     . Multiple Vitamin (MULTIVITAMIN WITH MINERALS) TABS tablet Take 1 tablet by mouth daily.    . norethindrone (MICRONOR,CAMILA,ERRIN) 0.35 MG tablet As directed daily    . Progesterone Micronized (PROGESTERONE PO) Take 300 mg by mouth daily.  0  . propranolol (INDERAL) 20 MG tablet Take 20 mg by mouth as needed. Reported on 02/09/2016    . valACYclovir (VALTREX) 1000 MG tablet as needed.      No current facility-administered medications on file prior to visit.     No Known Allergies  Family History  Problem Relation Age of Onset  . Atrial fibrillation Father   . Hypertension Father   . Atrial fibrillation Paternal Grandmother   . Heart disease Paternal Grandmother     Social History   Social History  . Marital status: Single    Spouse name: N/A  . Number of children: N/A  . Years of education: N/A   Social History Main Topics  . Smoking status: Never Smoker  . Smokeless tobacco: Never Used  . Alcohol use 2.4 oz/week    4 Glasses of wine per week  . Drug use: No  . Sexual activity: Yes   Birth control/ protection: Pill   Other Topics Concern  . None   Social History Narrative  . None    Review of Systems - See HPI.  All other ROS are negative.  BP 120/72   Pulse 78   Temp 98.7 F (37.1 C) (Oral)   Resp 14   Ht 5\' 9"  (1.753 m)   Wt 209 lb (94.8 kg)   SpO2 98%   BMI 30.86 kg/m   Physical Exam  Constitutional: She is oriented to person, place, and time and well-developed, well-nourished, and in no distress.  HENT:  Head: Normocephalic and atraumatic.  Eyes: Conjunctivae are normal.  Neck: Neck supple.  Cardiovascular: Normal rate, regular rhythm, normal heart sounds and intact distal pulses.   Pulmonary/Chest: Effort normal and breath sounds normal.  Neurological: She is alert and oriented to person, place, and time.  Skin:     Psychiatric: Affect normal.  Vitals reviewed.   Recent Results (from the past 2160 hour(s))  Lipid panel     Status: Abnormal   Collection Time: 07/30/17  9:58 AM  Result Value Ref Range   Cholesterol 193 0 - 200 mg/dL    Comment: ATP III Classification       Desirable:  < 200 mg/dL  Borderline High:  200 - 239 mg/dL          High:  > = 240 mg/dL   Triglycerides 71.0 0.0 - 149.0 mg/dL    Comment: Normal:  <150 mg/dLBorderline High:  150 - 199 mg/dL   HDL 59.90 >39.00 mg/dL   VLDL 14.2 0.0 - 40.0 mg/dL   LDL Cholesterol 119 (H) 0 - 99 mg/dL   Total CHOL/HDL Ratio 3     Comment:                Men          Women1/2 Average Risk     3.4          3.3Average Risk          5.0          4.42X Average Risk          9.6          7.13X Average Risk          15.0          11.0                       NonHDL 132.84     Comment: NOTE:  Non-HDL goal should be 30 mg/dL higher than patient's LDL goal (i.e. LDL goal of < 70 mg/dL, would have non-HDL goal of < 100 mg/dL)  Basic metabolic panel     Status: None   Collection Time: 07/30/17  9:58 AM  Result Value Ref Range   Sodium 138 135 - 145 mEq/L   Potassium 4.3 3.5 - 5.1 mEq/L    Chloride 104 96 - 112 mEq/L   CO2 26 19 - 32 mEq/L   Glucose, Bld 98 70 - 99 mg/dL   BUN 14 6 - 23 mg/dL   Creatinine, Ser 0.66 0.40 - 1.20 mg/dL   Calcium 9.4 8.4 - 10.5 mg/dL   GFR 110.21 >60.00 mL/min  TSH     Status: None   Collection Time: 07/30/17  9:58 AM  Result Value Ref Range   TSH 1.31 0.35 - 4.50 uIU/mL  Hepatic function panel     Status: None   Collection Time: 07/30/17  9:58 AM  Result Value Ref Range   Total Bilirubin 0.8 0.2 - 1.2 mg/dL   Bilirubin, Direct 0.1 0.0 - 0.3 mg/dL   Alkaline Phosphatase 73 39 - 117 U/L   AST 27 0 - 37 U/L   ALT 21 0 - 35 U/L   Total Protein 6.3 6.0 - 8.3 g/dL   Albumin 4.2 3.5 - 5.2 g/dL  CBC with Differential/Platelet     Status: None   Collection Time: 07/30/17  9:58 AM  Result Value Ref Range   WBC 6.3 4.0 - 10.5 K/uL   RBC 4.10 3.87 - 5.11 Mil/uL   Hemoglobin 13.7 12.0 - 15.0 g/dL   HCT 40.2 36.0 - 46.0 %   MCV 98.0 78.0 - 100.0 fl   MCHC 34.1 30.0 - 36.0 g/dL   RDW 12.6 11.5 - 15.5 %   Platelets 233.0 150.0 - 400.0 K/uL   Neutrophils Relative % 65.5 43.0 - 77.0 %   Lymphocytes Relative 23.8 12.0 - 46.0 %   Monocytes Relative 7.7 3.0 - 12.0 %   Eosinophils Relative 2.4 0.0 - 5.0 %   Basophils Relative 0.6 0.0 - 3.0 %   Neutro Abs 4.1 1.4 - 7.7 K/uL   Lymphs Abs 1.5 0.7 -  4.0 K/uL   Monocytes Absolute 0.5 0.1 - 1.0 K/uL   Eosinophils Absolute 0.2 0.0 - 0.7 K/uL   Basophils Absolute 0.0 0.0 - 0.1 K/uL    Assessment/Plan: 1. Benign skin lesion of thigh Mild irritation and erythema with tenderness. No surrounding induration or erythema. Likely friction lesion that became inflamed. Supportive measures and care discussed. Doxycycline printed and instruction on proper use if needed given to patient.    Leeanne Rio, PA-C

## 2017-08-03 NOTE — Progress Notes (Signed)
Pre visit review using our clinic review tool, if applicable. No additional management support is needed unless otherwise documented below in the visit note. 

## 2017-08-03 NOTE — Patient Instructions (Signed)
Please keep skin clean and dry. Apply topical antibiotic given to the area in small amounts.  You can keep covered if you would like.  Symptoms should continue to improve/resolve.  If there is any worsening pain or increase in area of redness, tenderness, please start the oral antibiotic and take as directed. Call me if you have to start hte oral antibiotic.

## 2017-08-29 ENCOUNTER — Encounter: Payer: Self-pay | Admitting: Interventional Cardiology

## 2017-09-06 ENCOUNTER — Ambulatory Visit: Payer: 59 | Admitting: Interventional Cardiology

## 2017-09-06 ENCOUNTER — Encounter: Payer: Self-pay | Admitting: Interventional Cardiology

## 2017-09-06 VITALS — BP 120/82 | HR 71 | Ht 69.0 in | Wt 219.0 lb

## 2017-09-06 DIAGNOSIS — R002 Palpitations: Secondary | ICD-10-CM

## 2017-09-06 DIAGNOSIS — Z86718 Personal history of other venous thrombosis and embolism: Secondary | ICD-10-CM

## 2017-09-06 DIAGNOSIS — I493 Ventricular premature depolarization: Secondary | ICD-10-CM

## 2017-09-06 DIAGNOSIS — Z86711 Personal history of pulmonary embolism: Secondary | ICD-10-CM

## 2017-09-06 NOTE — Patient Instructions (Addendum)
Medication Instructions:  Your physician recommends that you continue on your current medications as directed. Please refer to the Current Medication list given to you today.   Labwork: None ordered  Testing/Procedures: None ordered  Follow-Up: Your physician wants you to follow-up in: 1 year with Dr. Irish Lack. You will receive a reminder letter in the mail two months in advance. If you don't receive a letter, please call our office to schedule the follow-up appointment.   Any Other Special Instructions Will Be Listed Below (If Applicable).  Let us know if your symptoms worsen and we will order a monitor.   If you need a refill on your cardiac medications before your next appointment, please call your pharmacy.

## 2017-09-06 NOTE — Progress Notes (Signed)
Cardiology Office Note   Date:  09/06/2017   ID:  Ervin, Rothbauer [birth year], MRN [id number]  PCP:  Midge Minium, MD    No chief complaint on file.  Low HR/palpitations  Wt Readings from Last 3 Encounters:  09/06/17 219 lb (99.3 kg)  08/03/17 209 lb (94.8 kg)  07/30/17 209 lb 4 oz (94.9 kg)       History of Present Illness: Gail Foster is a 31 y.o. female who is being seen today for the evaluation of low HR, palpitations at the request of Midge Minium, MD.  She has a h/o DVT in 2017 and was treated with Xarelto for 1 year.  Echocardiogram at that time showed normal LV and RV function.  I saw this patient years ago for PVCs.  She had a negative workup.  Multiple people in her family including her father have had symptomatic PVCs.  The patient has had episodic fatigue, usually at rest.  She has trouble waking up in the morning some days.  She just feels very fatigued.  Her home heart rate monitor has showed a low heart rate, sometimes in the 40s.  It was also noted at a doctor's appointment to be in the 40s at one time.  She has not had lightheadedness or syncope.  She has no problems with strenuous workouts.  She does drink coffee 5 days a week.  ECG was done with her primary care doctor and revealed frequent PVCs.  Denies : Chest pain. Dizziness. Leg edema. Nitroglycerin use. Orthopnea. Paroxysmal nocturnal dyspnea. Shortness of breath. Syncope.   She does describe feeling a hard heartbeat at times.  It is intermittent.  There are no specific triggers, although she notes that it only happens in the morning and when she is at rest.  It does not affect her in the middle of the day or with activity.      Past Medical History:  Diagnosis Date  . Anemia    history of  . Anxiety   . Chronic lower back pain   . DVT (deep venous thrombosis) ([facility]) 02/03/2016   LLE  . Leg cramping    LLE, "calf, behind knee" (02/03/2016)  . Lumbar herniated disc   . Pulmonary  embolism ([facility]) 02/03/2016   bilateral PE, R > L    Past Surgical History:  Procedure Laterality Date  . DERMOID CYST  EXCISION Right 11/24/15  . LAPAROTOMY Right 11/24/2015   Procedure: LAPAROTOMY RIGHT OVARIAN CYSTECTOMY;  Surgeon: Marylynn Pearson, MD;  Location: [facility];  Service: Gynecology;  Laterality: Right;     Current Outpatient Medications  Medication Sig Dispense Refill  . ALPRAZolam (XANAX) 0.5 MG tablet Take 0.5 mg by mouth 2 (two) times daily as needed for anxiety.     Marland Kitchen doxycycline (VIBRAMYCIN) 100 MG capsule Take 1 capsule (100 mg total) by mouth 2 (two) times daily. 14 capsule 0  . Multiple Vitamin (MULTIVITAMIN WITH MINERALS) TABS tablet Take 1 tablet by mouth daily.    . norethindrone (MICRONOR,CAMILA,ERRIN) 0.35 MG tablet As directed daily    . Progesterone Micronized (PROGESTERONE PO) Take 300 mg by mouth daily.  0  . propranolol (INDERAL) 20 MG tablet Take 20 mg by mouth as needed. Reported on 02/09/2016    . valACYclovir (VALTREX) 1000 MG tablet as needed.      No current facility-administered medications for this visit.     Allergies:   Patient has no known allergies.  Social History:  The patient  reports that  has never smoked. she has never used smokeless tobacco. She reports that she drinks about 2.4 oz of alcohol per week. She reports that she does not use drugs.   Family History:  The patient's family history includes Atrial fibrillation in her father and paternal grandmother; Heart disease in her paternal grandmother; Hypertension in her father.    ROS:  Please see the history of present illness.   Otherwise, review of systems are positive for intermittent fatigue.   All other systems are reviewed and negative.    PHYSICAL EXAM: VS:  BP 120/82   Pulse 71   Ht 5\' 9"  (1.753 m)   Wt 219 lb (99.3 kg)   SpO2 99%   BMI 32.34 kg/m  , BMI Body mass index is 32.34 kg/m. GEN: Well nourished, well developed, in no acute distress  HEENT: normal  Neck: no  JVD, carotid bruits, or masses Cardiac: RRR; no murmurs, rubs, or gallops,no edema  Respiratory:  clear to auscultation bilaterally, normal work of breathing GI: soft, nontender, nondistended, + BS MS: no deformity or atrophy  Skin: warm and dry, no rash Neuro:  Strength and sensation are intact Psych: euthymic mood, full affect   EKG:   The ekg ordered in October 2018 demonstrates normal sinus rhythm with every fourth beat being a PVC   Recent Labs: 07/30/2017: ALT 21; BUN 14; Creatinine, Ser 0.66; Hemoglobin 13.7; Platelets 233.0; Potassium 4.3; Sodium 138; TSH 1.31   Lipid Panel    Component Value Date/Time   CHOL 193 07/30/2017 0958   TRIG 71.0 07/30/2017 0958   HDL 59.90 07/30/2017 0958   CHOLHDL 3 07/30/2017 0958   VLDL 14.2 07/30/2017 0958   LDLCALC 119 (H) 07/30/2017 0958     Other studies Reviewed: Additional studies/ records that were reviewed today with results demonstrating: 2017 echocardiogram reviewed.   ASSESSMENT AND PLAN:  1. Palpitations/Low heart rate/PVCs: I think the PVCs are likely the cause of the perceived low heart rate.  Checking the heart rate at the wrist may miss some of the PVCs.  The feeling that she has of the intermittent heart heartbeat is likely related to the post PVC beat, after increased time for diastolic filling.  We discussed a Holter monitor to determine the burden of her PVCs.  She notes that on some days, she does not feel any PVCs and therefore, is not sure that it will be high yield.  If her symptoms get more frequent or more severe, she will let us know and we will plan on a 24-hour Holter monitor.  No signs or symptoms of any high risk arrhythmia.  No syncope.  He stay well-hydrated and replace electrolytes post workout. 2. Prior DVT/PE: Treated with Xarelto. 3. I asked her to try to minimize her caffeine intake as well.   Current medicines are reviewed at length with the patient today.  The patient concerns regarding her  medicines were addressed.  The following changes have been made:  No change  Labs/ tests ordered today include:  No orders of the defined types were placed in this encounter.   Recommend 150 minutes/week of aerobic exercise Low fat, low carb, high fiber diet recommended  Disposition:   FU in 1 year or sooner if her symptoms get worse.   Signed, Larae Grooms, MD  09/06/2017 10:08 AM    [facility] [address], [place], [place]  [address] Phone: [phone]; Fax: [phone]   

## 2017-10-02 LAB — HM PAP SMEAR

## 2017-12-23 IMAGING — US US EXTREM LOW VENOUS*L*
1 series · 13 of 24 positions shown · non-contrast
Comparison: None.

CLINICAL DATA: Left calf pain for 10 days, initial encounter



[Series 1: us extrem low venous*left* · 0.08mm/px · 13 of 36 slices shown]
[im 1/36]
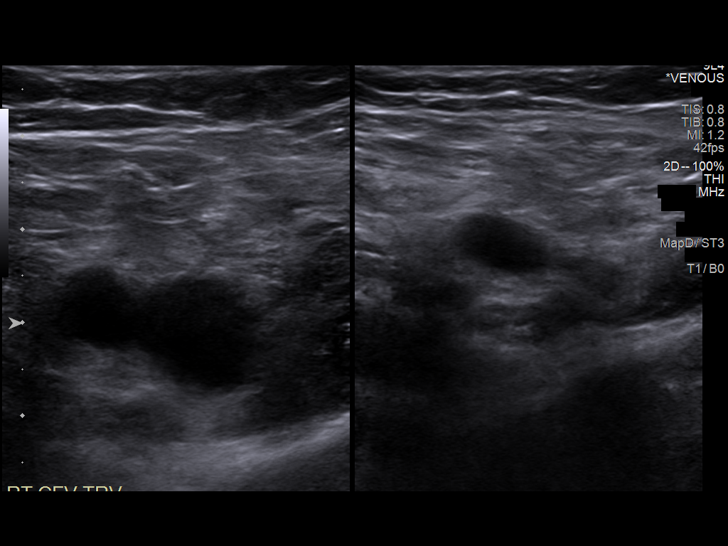
[im 4/36]
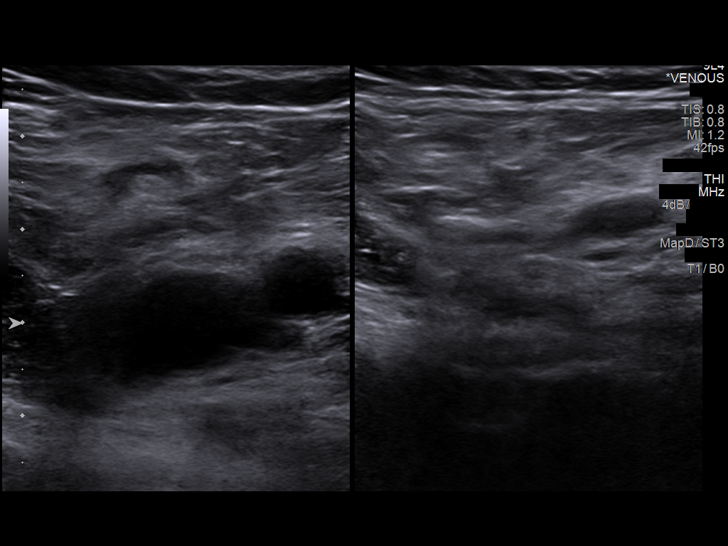
[im 7/36]
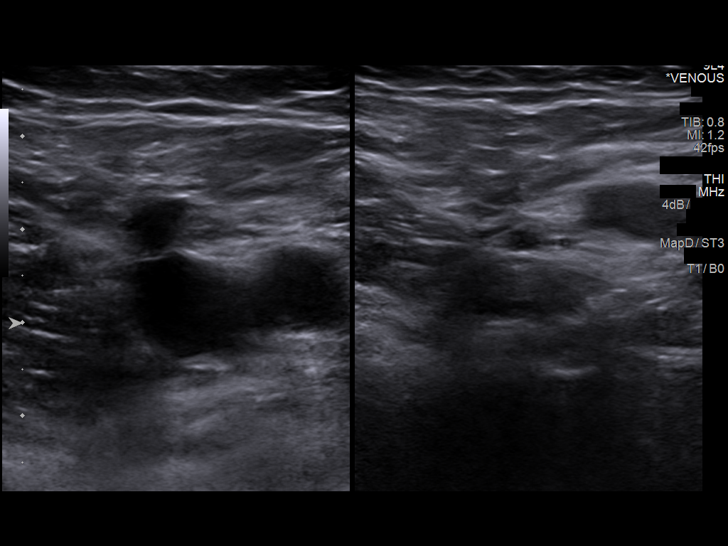
[im 10/36]
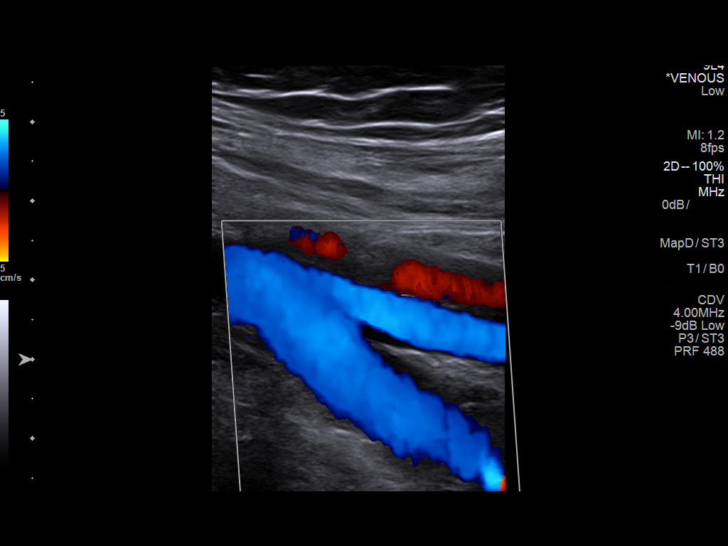
[im 13/36]
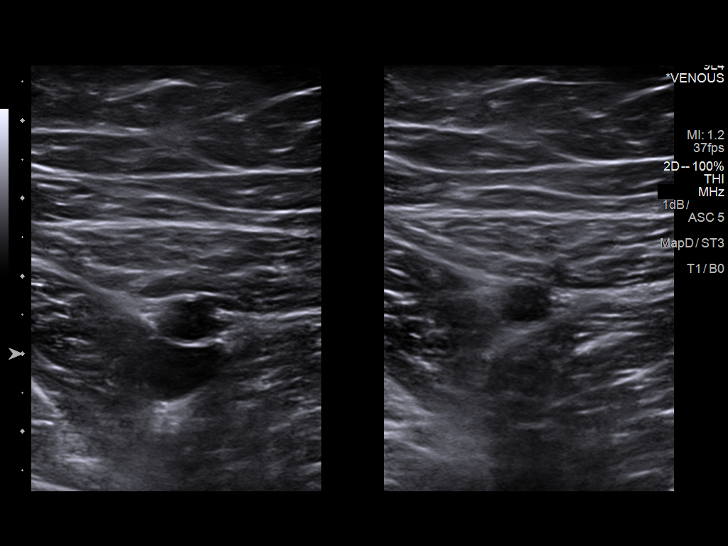
[im 16/36]
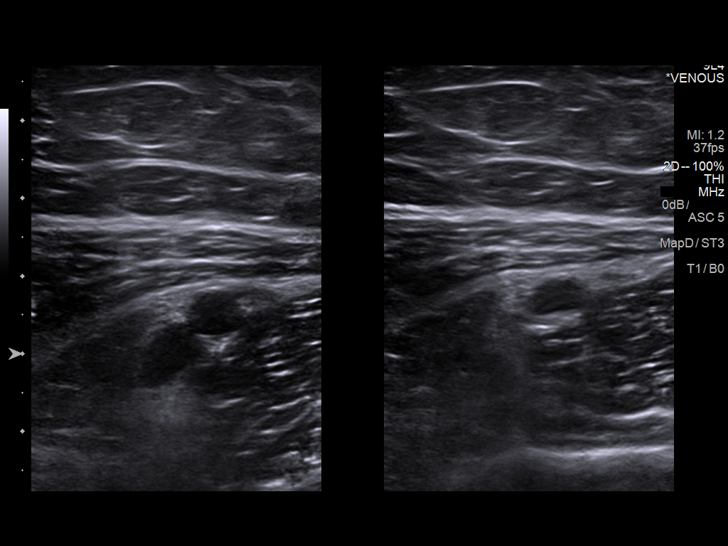
[im 19/36]
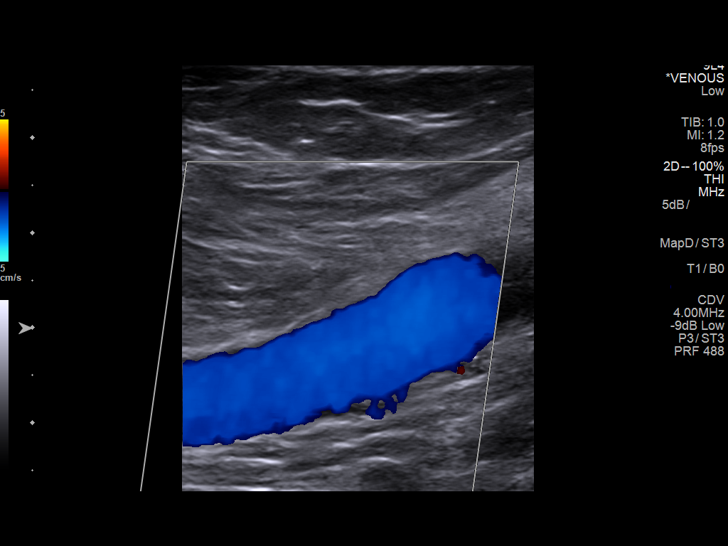
[im 20/36]
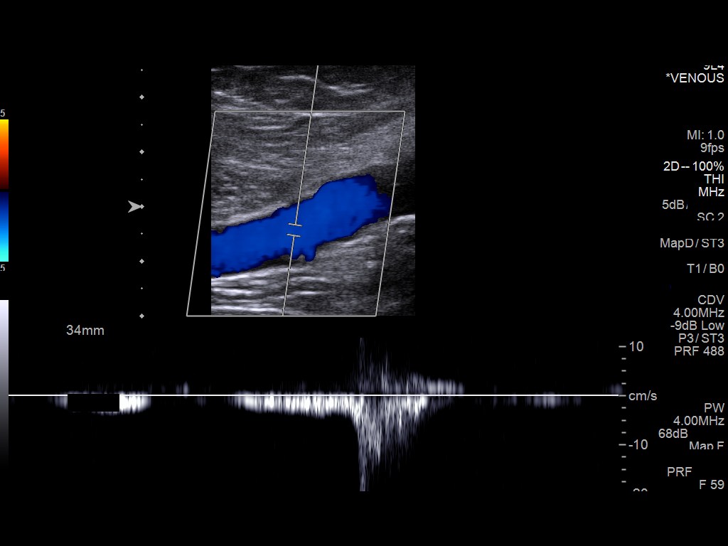
[im 23/36]
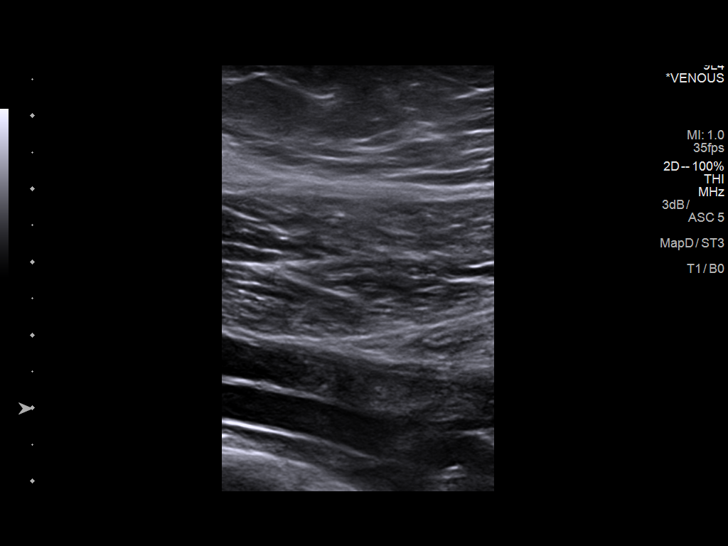
[im 26/36]
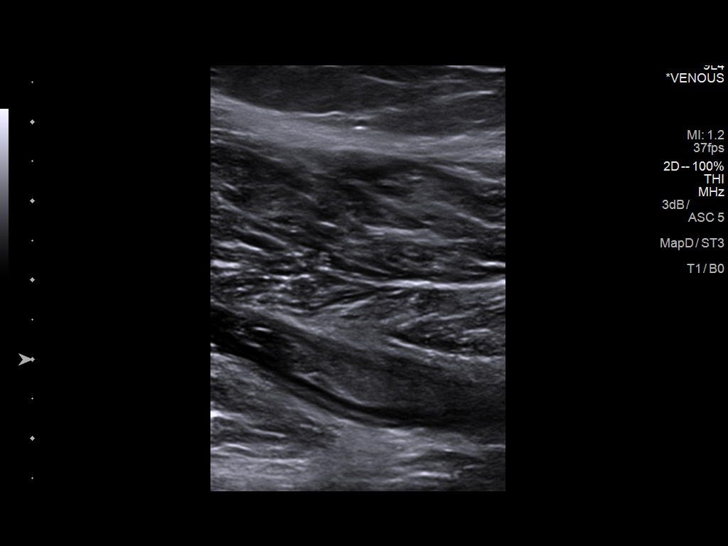
[im 29/36]
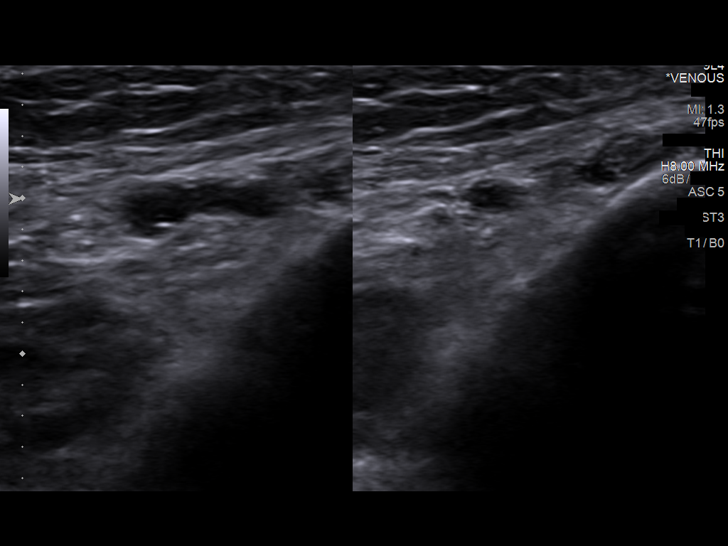
[im 32/36]
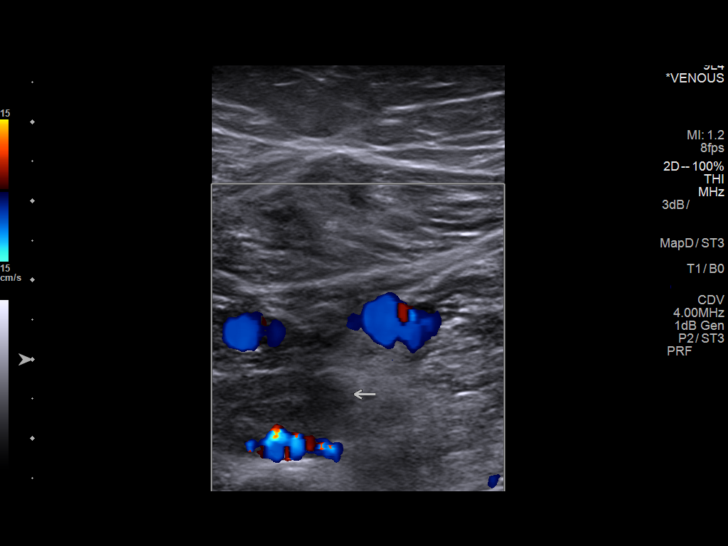
[im 36/36]
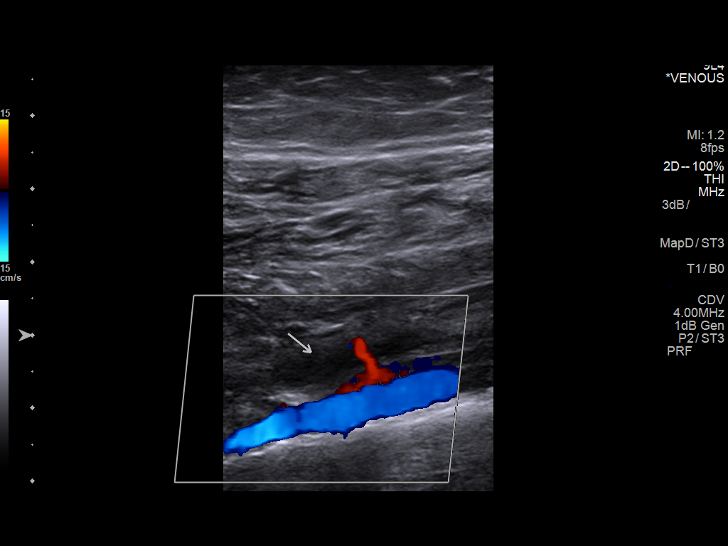

[13 of 24 positions shown; findings below may reference images not displayed]

FINDINGS: Contralateral Common Femoral Vein: Respiratory phasicity is normal
and symmetric with the symptomatic side. No evidence of thrombus.
Normal compressibility.

Common Femoral Vein: No evidence of thrombus. Normal
compressibility, respiratory phasicity and response to augmentation.

Saphenofemoral Junction: No evidence of thrombus. Normal
compressibility and flow on color Doppler imaging.

Profunda Femoral Vein: No evidence of thrombus. Normal
compressibility and flow on color Doppler imaging.

Femoral Vein: No evidence of thrombus. Normal compressibility,
respiratory phasicity and response to augmentation.

Popliteal Vein: No evidence of thrombus. Normal compressibility,
respiratory phasicity and response to augmentation.

Calf Veins: Thrombus is noted within peroneal vein extending into
the tibioperoneal trunk

Superficial Great Saphenous Vein: No evidence of thrombus. Normal
compressibility and flow on color Doppler imaging.

Venous Reflux:  None.

Other Findings:  None.
IMPRESSION: Infrapopliteal DVT

## 2018-02-01 ENCOUNTER — Ambulatory Visit: Payer: 59 | Admitting: Family Medicine

## 2018-02-08 ENCOUNTER — Other Ambulatory Visit: Payer: Self-pay

## 2018-02-08 ENCOUNTER — Encounter: Payer: Self-pay | Admitting: Family Medicine

## 2018-02-08 ENCOUNTER — Ambulatory Visit: Payer: 59 | Admitting: Family Medicine

## 2018-02-08 VITALS — BP 114/74 | HR 74 | Temp 98.5°F | Resp 16 | Ht 69.0 in | Wt 210.0 lb

## 2018-02-08 DIAGNOSIS — E669 Obesity, unspecified: Secondary | ICD-10-CM | POA: Diagnosis not present

## 2018-02-08 NOTE — Patient Instructions (Signed)
Schedule your complete physical in 6 months  Keep up the good work on healthy diet and regular exercise- you look great!!! Call with any questions or concerns Have a great summer!!!

## 2018-02-08 NOTE — Assessment & Plan Note (Signed)
Ongoing issue.  Everything pt tells me that she's doing is on point- exercising regularly, eating well.  She is frustrated by her lack of progress.  Discussed that converting fat to muscle is a process and can be frustrated but once muscle is present it is more metabolically active and weight loss will be easier.  Encouraged her to continue her extensive efforts.  Will follow.

## 2018-02-08 NOTE — Progress Notes (Signed)
   Subjective:    Patient ID: Gail Foster, female    DOB: [birth year], 32 y.o.   MRN: [id number]  HPI Obesity- exercising 5 days/week at [employer] (similar to [employer]).  No CP, SOB, HAs, visual changes, edema.  Pt has been meal prepping since January.  Eating 1400 calories daily.   Review of Systems For ROS see HPI     Objective:   Physical Exam  Constitutional: She is oriented to person, place, and time. She appears well-developed and well-nourished. No distress.  HENT:  Head: Normocephalic and atraumatic.  Eyes: Pupils are equal, round, and reactive to light. Conjunctivae and EOM are normal.  Neck: Normal range of motion. Neck supple. No thyromegaly present.  Cardiovascular: Normal rate, regular rhythm, normal heart sounds and intact distal pulses.  No murmur heard. Pulmonary/Chest: Effort normal and breath sounds normal. No respiratory distress.  Abdominal: Soft. She exhibits no distension. There is no tenderness.  Musculoskeletal: She exhibits no edema.  Lymphadenopathy:    She has no cervical adenopathy.  Neurological: She is alert and oriented to person, place, and time.  Skin: Skin is warm and dry.  Psychiatric: She has a normal mood and affect. Her behavior is normal.  Vitals reviewed.         Assessment & Plan:

## 2018-05-21 DIAGNOSIS — M25571 Pain in right ankle and joints of right foot: Secondary | ICD-10-CM | POA: Diagnosis not present

## 2018-06-13 DIAGNOSIS — M25571 Pain in right ankle and joints of right foot: Secondary | ICD-10-CM | POA: Diagnosis not present

## 2018-07-12 DIAGNOSIS — M25571 Pain in right ankle and joints of right foot: Secondary | ICD-10-CM | POA: Diagnosis not present

## 2018-08-02 ENCOUNTER — Other Ambulatory Visit: Payer: Self-pay

## 2018-08-02 ENCOUNTER — Encounter: Payer: Self-pay | Admitting: Family Medicine

## 2018-08-02 ENCOUNTER — Ambulatory Visit (INDEPENDENT_AMBULATORY_CARE_PROVIDER_SITE_OTHER): Payer: BLUE CROSS/BLUE SHIELD | Admitting: Family Medicine

## 2018-08-02 VITALS — BP 102/70 | HR 60 | Temp 98.1°F | Resp 16 | Ht 69.0 in | Wt 206.0 lb

## 2018-08-02 DIAGNOSIS — F419 Anxiety disorder, unspecified: Secondary | ICD-10-CM | POA: Diagnosis not present

## 2018-08-02 DIAGNOSIS — E669 Obesity, unspecified: Secondary | ICD-10-CM

## 2018-08-02 DIAGNOSIS — Z23 Encounter for immunization: Secondary | ICD-10-CM | POA: Diagnosis not present

## 2018-08-02 DIAGNOSIS — Z0000 Encounter for general adult medical examination without abnormal findings: Secondary | ICD-10-CM | POA: Diagnosis not present

## 2018-08-02 LAB — HEPATIC FUNCTION PANEL
ALBUMIN: 4.4 g/dL (ref 3.5–5.2)
ALK PHOS: 79 U/L (ref 39–117)
ALT: 19 U/L (ref 0–35)
AST: 20 U/L (ref 0–37)
Bilirubin, Direct: 0.2 mg/dL (ref 0.0–0.3)
TOTAL PROTEIN: 6.8 g/dL (ref 6.0–8.3)
Total Bilirubin: 0.8 mg/dL (ref 0.2–1.2)

## 2018-08-02 LAB — CBC WITH DIFFERENTIAL/PLATELET
BASOS ABS: 0 10*3/uL (ref 0.0–0.1)
Basophils Relative: 0.5 % (ref 0.0–3.0)
EOS ABS: 0.1 10*3/uL (ref 0.0–0.7)
Eosinophils Relative: 2.7 % (ref 0.0–5.0)
HCT: 40.8 % (ref 36.0–46.0)
Hemoglobin: 13.8 g/dL (ref 12.0–15.0)
LYMPHS ABS: 1.7 10*3/uL (ref 0.7–4.0)
LYMPHS PCT: 30.4 % (ref 12.0–46.0)
MCHC: 33.8 g/dL (ref 30.0–36.0)
MCV: 95.1 fl (ref 78.0–100.0)
MONOS PCT: 9.4 % (ref 3.0–12.0)
Monocytes Absolute: 0.5 10*3/uL (ref 0.1–1.0)
NEUTROS ABS: 3.1 10*3/uL (ref 1.4–7.7)
Neutrophils Relative %: 57 % (ref 43.0–77.0)
Platelets: 234 10*3/uL (ref 150.0–400.0)
RBC: 4.29 Mil/uL (ref 3.87–5.11)
RDW: 12.7 % (ref 11.5–15.5)
WBC: 5.5 10*3/uL (ref 4.0–10.5)

## 2018-08-02 LAB — LIPID PANEL
CHOLESTEROL: 170 mg/dL (ref 0–200)
HDL: 48.4 mg/dL (ref >39.00)
LDL CALC: 106 mg/dL — AB (ref 0–99)
NonHDL: 121.74
Total CHOL/HDL Ratio: 4
Triglycerides: 77 mg/dL (ref 0.0–149.0)
VLDL: 15.4 mg/dL (ref 0.0–40.0)

## 2018-08-02 LAB — BASIC METABOLIC PANEL
BUN: 10 mg/dL (ref 6–23)
CHLORIDE: 104 meq/L (ref 96–112)
CO2: 27 mEq/L (ref 19–32)
CREATININE: 0.77 mg/dL (ref 0.40–1.20)
Calcium: 9.6 mg/dL (ref 8.4–10.5)
GFR: 91.68 mL/min (ref >60.00)
GLUCOSE: 95 mg/dL (ref 70–99)
POTASSIUM: 4 meq/L (ref 3.5–5.1)
Sodium: 139 mEq/L (ref 135–145)

## 2018-08-02 LAB — TSH: TSH: 1.25 u[IU]/mL (ref 0.35–4.50)

## 2018-08-02 MED ORDER — BUSPIRONE HCL 15 MG PO TABS: 15.0000 mg | ORAL_TABLET | Freq: Two times a day (BID) | ORAL | 3 refills | Status: DC

## 2018-08-02 NOTE — Assessment & Plan Note (Signed)
Pt's PE WNL w/ exception of obesity.  UTD on Tdap.  Flu given today.  Check labs.  Anticipatory guidance provided.  

## 2018-08-02 NOTE — Assessment & Plan Note (Signed)
New.  Pt is really struggling w/ anxiety.  Discussed this in detail and reviewed various tx options.  Start Buspar twice daily and follow closely.  Pt expressed understanding and is in agreement w/ plan.

## 2018-08-02 NOTE — Progress Notes (Addendum)
   Subjective:    Patient ID: Gail Foster, female    DOB: March 06, 1986, 32 y.o.   MRN: [id number]  HPI CPE- UTD on GYN, Tdap.  Due for flu.   Review of Systems Patient reports no vision/ hearing changes, adenopathy,fever, weight change,  persistant/recurrent hoarseness , swallowing issues, chest pain, palpitations, edema, persistant/recurrent cough, hemoptysis, dyspnea (rest/exertional/paroxysmal nocturnal), gastrointestinal bleeding (melena, rectal bleeding), abdominal pain, significant heartburn, bowel changes, GU symptoms (dysuria, hematuria, incontinence), Gyn symptoms (abnormal  bleeding, pain),  syncope, focal weakness, memory loss, numbness & tingling, skin/hair/nail changes, abnormal bruising or bleeding.   + anxiety- 'my anxiety has been kinda crazy the last few months'.  Having a harder time dealing w/ work stress.  Denies any personal stressors.  Sister just had another baby.  Not sleeping well.  Difficulty falling and staying asleep.  Taking Melatonin.  Unable to turn off brain.  Pt has Xanax available to use for panic attacks.  No concerns about depression.    Objective:   Physical Exam General Appearance:    Alert, cooperative, no distress, appears stated age, obese  Head:    Normocephalic, without obvious abnormality, atraumatic  Eyes:    PERRL, conjunctiva/corneas clear, EOM's intact, fundi    benign, both eyes  Ears:    Normal TM's and external ear canals, both ears  Nose:   Nares normal, septum midline, mucosa normal, no drainage    or sinus tenderness  Throat:   Lips, mucosa, and tongue normal; teeth and gums normal  Neck:   Supple, symmetrical, trachea midline, no adenopathy;    Thyroid: no enlargement/tenderness/nodules  Back:     Symmetric, no curvature, ROM normal, no CVA tenderness  Lungs:     Clear to auscultation bilaterally, respirations unlabored  Chest Wall:    No tenderness or deformity   Heart:    Regular rate and rhythm, S1 and S2 normal, no murmur, rub   or  gallop  Breast Exam:    Deferred to GYN  Abdomen:     Soft, non-tender, bowel sounds active all four quadrants,    no masses, no organomegaly  Genitalia:    Deferred to GYN  Rectal:    Extremities:   Extremities normal, atraumatic, no cyanosis or edema  Pulses:   2+ and symmetric all extremities  Skin:   Skin color, texture, turgor normal, no rashes or lesions  Lymph nodes:   Cervical, supraclavicular, and axillary nodes normal  Neurologic:   CNII-XII intact, normal strength, sensation and reflexes    throughout          Assessment & Plan:

## 2018-08-02 NOTE — Patient Instructions (Signed)
Follow up in 1 month to recheck anxiety We'll notify you of your lab results and make any changes if needed Continue to work on healthy diet and regular exercise- you look great!!! START the Buspar twice daily- start w/ 1/2 tab x1-2 weeks and then increase to 1 tab twice daily Call with any questions or concerns Hang in there!!!

## 2018-08-02 NOTE — Assessment & Plan Note (Signed)
Ongoing issue for pt.  She is exercising regularly and eating well.  Suspect that her high stress levels is interfering w/ weight loss progress.  Check labs to risk stratify.  Will follow.

## 2018-08-05 ENCOUNTER — Encounter: Payer: Self-pay | Admitting: General Practice

## 2018-09-03 ENCOUNTER — Ambulatory Visit: Payer: BLUE CROSS/BLUE SHIELD | Admitting: Family Medicine

## 2018-09-03 ENCOUNTER — Other Ambulatory Visit: Payer: Self-pay

## 2018-09-03 ENCOUNTER — Encounter: Payer: Self-pay | Admitting: Family Medicine

## 2018-09-03 VITALS — BP 110/72 | HR 76 | Temp 98.1°F | Resp 16 | Ht 69.0 in | Wt 213.5 lb

## 2018-09-03 DIAGNOSIS — F419 Anxiety disorder, unspecified: Secondary | ICD-10-CM | POA: Diagnosis not present

## 2018-09-03 NOTE — Assessment & Plan Note (Signed)
Much improved since starting Buspar.  Pt is thrilled with how she is feeling although notes that she continues to struggle just prior to her menstrual cycle.  Discussed using Alprazolam on those days to take the edge off.  Will hold off on increasing the Buspar since she is feeling so good the rest of the month.  Will continue to follow.

## 2018-09-03 NOTE — Progress Notes (Signed)
   Subjective:    Patient ID: TAMANIKA HEINEY, female    DOB: 1985/12/06, 32 y.o.   MRN: [id number]  HPI Anxiety- chronic problem, at last visit was started on Buspar BID.  Work gave her 2 weeks PTO prior to Thanksgiving which was a nice break to recharge.  Pt returned to work yesterday 'w/ flying colors'.  Pt notes she continues to struggle w/ anxiety w/ her cycle.  Pt reports she likes the medication- initially dizziness has subsided.  Less irritable.     Review of Systems For ROS see HPI     Objective:   Physical Exam  Constitutional: She is oriented to person, place, and time. She appears well-developed and well-nourished. No distress.  HENT:  Head: Normocephalic and atraumatic.  Neurological: She is alert and oriented to person, place, and time.  Skin: Skin is warm and dry.  Psychiatric: She has a normal mood and affect. Her behavior is normal. Thought content normal.  Vitals reviewed.         Assessment & Plan:

## 2018-09-03 NOTE — Patient Instructions (Addendum)
Follow up as needed or as scheduled CONTINUE the Buspar twice daily- take w/ food MONITOR your cycles and if needed take the Alprazolam on those high anxiety/stress days to prevent the emotions If the mood changes or worsens, let me know Call with any questions or concerns Happy Holidays!!!

## 2018-10-25 ENCOUNTER — Other Ambulatory Visit: Payer: Self-pay | Admitting: Family Medicine

## 2018-11-01 ENCOUNTER — Ambulatory Visit: Payer: Self-pay

## 2018-11-01 DIAGNOSIS — R197 Diarrhea, unspecified: Secondary | ICD-10-CM | POA: Diagnosis not present

## 2018-11-01 NOTE — Telephone Encounter (Signed)
Returned call to patient who states that she has had 25 watery stools today.  She states that yesterday at work they had a chili cook off.  She says that just a few hours after she developed cramping abdominal pain. She say that evening she had 2 normal BMs but it continued to become loose and watery.  She denies vomiting. She has had chills but has not checked for fever.  She is drinking water and gatoraide.  She denies lightheadedness. She says she voids with her stool, at least 4 times today.  She does not know what color her urine is because of the stool. She says her cramping is moderate at 4-5. She denies exposure to anyone with diarrhea. Per protocol pt see a heath professional but she refuses urgent care and refuses Saturday clinic. Home care advice read to patient. Pt verbalized understanding that she should be see. Office was consulted for possible appointment. No appointment available. Reason for Disposition . [1] SEVERE diarrhea (e.g., 7 or more times / day more than normal) AND [2] present > 24 hours (1 day)  Answer Assessment - Initial Assessment Questions 1. DIARRHEA SEVERITY: "How bad is the diarrhea?" "How many extra stools have you had in the past 24 hours than normal?"    - NO DIARRHEA (SCALE 0)   - MILD (SCALE 1-3): Few loose or mushy BMs; increase of 1-3 stools over normal daily number of stools; mild increase in ostomy output.   -  MODERATE (SCALE 4-7): Increase of 4-6 stools daily over normal; moderate increase in ostomy output. * SEVERE (SCALE 8-10; OR 'WORST POSSIBLE'): Increase of 7 or more stools daily over normal; moderate increase in ostomy output; incontinence.     25 2. ONSET: "When did the diarrhea begin?"      Yesterday afternoon 3. BM CONSISTENCY: "How loose or watery is the diarrhea?"      watery 4. VOMITING: "Are you also vomiting?" If so, ask: "How many times in the past 24 hours?"      no 5. ABDOMINAL PAIN: "Are you having any abdominal pain?" If yes: "What does  it feel like?" (e.g., crampy, dull, intermittent, constant)      crampy and noisy 6. ABDOMINAL PAIN SEVERITY: If present, ask: "How bad is the pain?"  (e.g., Scale 1-10; mild, moderate, or severe)   - MILD (1-3): doesn't interfere with normal activities, abdomen soft and not tender to touch    - MODERATE (4-7): interferes with normal activities or awakens from sleep, tender to touch    - SEVERE (8-10): excruciating pain, doubled over, unable to do any normal activities       4-5 7. ORAL INTAKE: If vomiting, "Have you been able to drink liquids?" "How much fluids have you had in the past 24 hours?"     Water and gatorade  8. HYDRATION: "Any signs of dehydration?" (e.g., dry mouth [not just dry lips], too weak to stand, dizziness, new weight loss) "When did you last urinate?"     At least 4 times today urinating with BM 9. EXPOSURE: "Have you traveled to a foreign country recently?" "Have you been exposed to anyone with diarrhea?" "Could you have eaten any food that was spoiled?"    Could be spoiled food 10. ANTIBIOTIC USE: "Are you taking antibiotics now or have you taken antibiotics in the past 2 months?"      no 11. OTHER SYMPTOMS: "Do you have any other symptoms?" (e.g., fever, blood in stool)  No but having chills 12. PREGNANCY: "Is there any chance you are pregnant?" "When was your last menstrual period?"       No started period today  Protocols used: DIARRHEA-A-AH

## 2018-11-04 ENCOUNTER — Encounter: Payer: Self-pay | Admitting: Physician Assistant

## 2018-11-04 ENCOUNTER — Ambulatory Visit: Payer: BLUE CROSS/BLUE SHIELD | Admitting: Physician Assistant

## 2018-11-04 ENCOUNTER — Ambulatory Visit: Payer: Self-pay | Admitting: *Deleted

## 2018-11-04 ENCOUNTER — Other Ambulatory Visit: Payer: Self-pay

## 2018-11-04 VITALS — BP 110/80 | HR 82 | Temp 98.9°F | Resp 16 | Ht 69.0 in | Wt 210.0 lb

## 2018-11-04 DIAGNOSIS — K529 Noninfective gastroenteritis and colitis, unspecified: Secondary | ICD-10-CM

## 2018-11-04 DIAGNOSIS — T50905A Adverse effect of unspecified drugs, medicaments and biological substances, initial encounter: Secondary | ICD-10-CM | POA: Diagnosis not present

## 2018-11-04 LAB — COMPREHENSIVE METABOLIC PANEL
ALT: 61 U/L — ABNORMAL HIGH (ref 0–35)
AST: 49 U/L — ABNORMAL HIGH (ref 0–37)
Albumin: 4.4 g/dL (ref 3.5–5.2)
Alkaline Phosphatase: 78 U/L (ref 39–117)
BUN: 8 mg/dL (ref 6–23)
CO2: 26 mEq/L (ref 19–32)
Calcium: 9.5 mg/dL (ref 8.4–10.5)
Chloride: 106 mEq/L (ref 96–112)
Creatinine, Ser: 0.73 mg/dL (ref 0.40–1.20)
GFR: 91.59 mL/min (ref >60.00)
Glucose, Bld: 94 mg/dL (ref 70–99)
POTASSIUM: 4.3 meq/L (ref 3.5–5.1)
SODIUM: 139 meq/L (ref 135–145)
Total Bilirubin: 0.4 mg/dL (ref 0.2–1.2)
Total Protein: 6.6 g/dL (ref 6.0–8.3)

## 2018-11-04 LAB — CBC WITH DIFFERENTIAL/PLATELET
Basophils Absolute: 0 10*3/uL (ref 0.0–0.1)
Basophils Relative: 0.3 % (ref 0.0–3.0)
Eosinophils Absolute: 0.2 10*3/uL (ref 0.0–0.7)
Eosinophils Relative: 3.1 % (ref 0.0–5.0)
HCT: 43.5 % (ref 36.0–46.0)
Hemoglobin: 14.9 g/dL (ref 12.0–15.0)
LYMPHS PCT: 16.4 % (ref 12.0–46.0)
Lymphs Abs: 1 10*3/uL (ref 0.7–4.0)
MCHC: 34.1 g/dL (ref 30.0–36.0)
MCV: 95.7 fl (ref 78.0–100.0)
Monocytes Absolute: 0.5 10*3/uL (ref 0.1–1.0)
Monocytes Relative: 7.8 % (ref 3.0–12.0)
Neutro Abs: 4.6 10*3/uL (ref 1.4–7.7)
Neutrophils Relative %: 72.4 % (ref 43.0–77.0)
Platelets: 223 10*3/uL (ref 150.0–400.0)
RBC: 4.55 Mil/uL (ref 3.87–5.11)
RDW: 12.6 % (ref 11.5–15.5)
WBC: 6.4 10*3/uL (ref 4.0–10.5)

## 2018-11-04 MED ORDER — ONDANSETRON HCL 4 MG PO TABS: 4.0000 mg | ORAL_TABLET | Freq: Three times a day (TID) | ORAL | 0 refills | Status: DC | PRN

## 2018-11-04 MED ORDER — HYDROXYZINE HCL 10 MG PO TABS: 10.0000 mg | ORAL_TABLET | Freq: Three times a day (TID) | ORAL | 0 refills | Status: DC | PRN

## 2018-11-04 NOTE — Progress Notes (Signed)
Patient presents to clinic today c/o significant itching after starting Immodium OTC for gastroenteritis. Patient endorses significant diarrhea starting late Thursday night and early Friday morning. Noted loose stool every 15-20 minutes. Was evaluated at Urgent Care and diagnosed with food poisoning. Was told to hydrate, follow BRAT diet and start immodium. Notes symptoms have calmed down but each time she has had to use the Immodium she has noted it makes her extremely itchy. Last use was yesterday and noted redness of face with itching.   Past Medical History:  Diagnosis Date  . Anemia    history of  . Anxiety   . Chronic lower back pain   . DVT (deep venous thrombosis) ([place]) 02/03/2016   LLE  . Leg cramping    LLE, "calf, behind knee" (02/03/2016)  . Lumbar herniated disc   . Pulmonary embolism ([place]) 02/03/2016   bilateral PE, R > L    Current Outpatient Medications on File Prior to Visit  Medication Sig Dispense Refill  . ALPRAZolam (XANAX) 0.5 MG tablet Take 0.5 mg by mouth 2 (two) times daily as needed for anxiety.     . busPIRone (BUSPAR) 15 MG tablet TAKE 1 TABLET BY MOUTH TWICE A DAY 180 tablet 2  . Multiple Vitamin (MULTIVITAMIN WITH MINERALS) TABS tablet Take 1 tablet by mouth daily.    . norethindrone (MICRONOR,CAMILA,ERRIN) 0.35 MG tablet As directed daily    . propranolol (INDERAL) 20 MG tablet Take 20 mg by mouth as needed. Reported on 02/09/2016    . valACYclovir (VALTREX) 1000 MG tablet as needed.      No current facility-administered medications on file prior to visit.     No Known Allergies  Family History  Problem Relation Age of Onset  . Atrial fibrillation Father   . Hypertension Father   . Atrial fibrillation Paternal Grandmother   . Heart disease Paternal Grandmother     Social History   Socioeconomic History  . Marital status: Single    Spouse name: Not on file  . Number of children: Not on file  . Years of education: Not on file  . Highest  education level: Not on file  Occupational History  . Not on file  Social Needs  . Financial resource strain: Not on file  . Food insecurity:    Worry: Not on file    Inability: Not on file  . Transportation needs:    Medical: Not on file    Non-medical: Not on file  Tobacco Use  . Smoking status: Never Smoker  . Smokeless tobacco: Never Used  Substance and Sexual Activity  . Alcohol use: Yes    Alcohol/week: 4.0 standard drinks    Types: 4 Glasses of wine per week  . Drug use: No  . Sexual activity: Yes    Birth control/protection: Pill  Lifestyle  . Physical activity:    Days per week: Not on file    Minutes per session: Not on file  . Stress: Not on file  Relationships  . Social connections:    Talks on phone: Not on file    Gets together: Not on file    Attends religious service: Not on file    Active member of club or organization: Not on file    Attends meetings of clubs or organizations: Not on file    Relationship status: Not on file  Other Topics Concern  . Not on file  Social History Narrative  . Not on file   Review  of Systems - See HPI.  All other ROS are negative.  BP 110/80   Pulse 82   Temp 98.9 F (37.2 C) (Oral)   Resp 16   Ht 5' 9" (1.753 m)   Wt 210 lb (95.3 kg)   SpO2 98%   BMI 31.01 kg/m   Physical Exam Vitals signs reviewed.  Constitutional:      Appearance: Normal appearance.  HENT:     Head: Normocephalic and atraumatic.     Right Ear: Tympanic membrane normal.     Left Ear: Tympanic membrane normal.     Nose: Nose normal.     Mouth/Throat:     Mouth: Mucous membranes are moist.  Eyes:     Conjunctiva/sclera: Conjunctivae normal.  Neck:     Musculoskeletal: Neck supple.  Cardiovascular:     Rate and Rhythm: Normal rate and regular rhythm.     Pulses: Normal pulses.  Pulmonary:     Effort: Pulmonary effort is normal.     Breath sounds: Normal breath sounds.  Abdominal:     General: Bowel sounds are increased. There is no  distension.     Palpations: Abdomen is soft.     Tenderness: There is no abdominal tenderness.  Neurological:     Mental Status: She is alert.    Assessment/Plan: 1. Gastroenteritis Improving. Exam unremarkable. Will check CBC and CMP today. Rx Zofran. Stop Immodium. Start [employer].  - CBC w/Diff - Comp Met (CMET) - ondansetron (ZOFRAN) 4 MG tablet; Take 1 tablet (4 mg total) by mouth every 8 (eight) hours as needed for nausea or vomiting.  Dispense: 20 tablet; Refill: 0  2. Adverse effect of drug, initial encounter Stop Immodium. Rx hydroxyzine to help with lingering itch until medication is out of system. Medication added to allergy list. - hydrOXYzine (ATARAX/VISTARIL) 10 MG tablet; Take 1 tablet (10 mg total) by mouth 3 (three) times daily as needed.  Dispense: 30 tablet; Refill: 0   Leeanne Rio, PA-C

## 2018-11-04 NOTE — Telephone Encounter (Signed)
Pt called with having a rash and itching on her neck and face after taking imodium for diarrhea. She stated that she had to gone to an urgent care and was advise to take imodium for the diarrhea last Friday. No fever. She took a dose of imodium at 9 pm, 10 pm and 11 pm.  That did help the diarrhea but then she started itching on her neck the next morning. Did not notice a rash then. After about 24 hours the diarrhea started back and she took more imodium.  Woke up this morning with severe itching on her neck, and face. Noticeable around her mouth, lips swollen. She took her last imodium at midnight last night, took 2.  Still not fever. Flow notified at [facility] at [facility] regarding appointments. Appointments scheduled for this morning. Home care advice given with verbal understanding.  Reason for Disposition . Hives or itching  Answer Assessment - Initial Assessment Questions 1. SYMPTOMS: "Do you have any symptoms?"     Itching and face swelling 2. SEVERITY: If symptoms are present, ask "Are they mild, moderate or severe?"     Moderate and severe.  Answer Assessment - Initial Assessment Questions 1. APPEARANCE of RASH: "Describe the rash." (e.g., spots, blisters, raised areas, skin peeling, scaly)     Raised areas on face 2. SIZE: "How big are the spots?" (e.g., tip of pen, eraser, coin; inches, centimeters)     On face around lips 3. LOCATION: "Where is the rash located?"     Face and neck 4. COLOR: "What color is the rash?" (Note: It is difficult to assess rash color in people with darker-colored skin. When this situation occurs, simply ask the caller to describe what they see.)     red 5. ONSET: "When did the rash begin?"     This morning 6. FEVER: "Do you have a fever?" If so, ask: "What is your temperature, how was it measured, and when did it start?"     no 7. ITCHING: "Does the rash itch?" If so, ask: "How bad is the itch?" (Scale 1-10; or mild, moderate, severe)  yes 8. CAUSE: "What do you think is causing the rash?"     Thinks taking imodium  9. NEW MEDICATION: "What new medication are you taking?" (e.g., name of antibiotic) "When did you start taking this medication?".     Taking imodium 10. OTHER SYMPTOMS: "Do you have any other symptoms?" (e.g., sore throat, fever, joint pain)       no 11. PREGNANCY: "Is there any chance you are pregnant?" "When was your last menstrual period?"  Protocols used: RASH - WIDESPREAD ON DRUGS-A-AH, MEDICATION QUESTION CALL-A-AH

## 2018-11-04 NOTE — Patient Instructions (Addendum)
Please go to the lab today for blood work.  I will call you with your results. We will alter treatment regimen(s) if indicated by your results.   Please stop the immodium.  Take the hydroxyzine as directed if needed for itch. As the immodium gets out of your system, symptoms should resolve.   The Zofran is to use as directed if needed for nausea.   Follow dietary recommendations below.   Bland Diet A bland diet consists of foods that are often soft and do not have a lot of fat, fiber, or extra seasonings. Foods without fat, fiber, or seasoning are easier for the body to digest. They are also less likely to irritate your mouth, throat, stomach, and other parts of your digestive system. A bland diet is sometimes called a BRAT diet. What is my plan? Your health care provider or food and nutrition specialist (dietitian) may recommend specific changes to your diet to prevent symptoms or to treat your symptoms. These changes may include:  Eating small meals often.  Cooking food until it is soft enough to chew easily.  Chewing your food well.  Drinking fluids slowly.  Not eating foods that are very spicy, sour, or fatty.  Not eating citrus fruits, such as oranges and grapefruit. What do I need to know about this diet?  Eat a variety of foods from the bland diet food list.  Do not follow a bland diet longer than needed.  Ask your health care provider whether you should take vitamins or supplements. What foods can I eat? Grains  Hot cereals, such as cream of wheat. Rice. Bread, crackers, or tortillas made from refined white flour. Vegetables Canned or cooked vegetables. Mashed or boiled potatoes. Fruits  Bananas. Applesauce. Other types of cooked or canned fruit with the skin and seeds removed, such as canned peaches or pears. Meats and other proteins  Scrambled eggs. Creamy peanut butter or other nut butters. Lean, well-cooked meats, such as chicken or fish. Tofu. Soups or  broths. Dairy Low-fat dairy products, such as milk, cottage cheese, or yogurt. Beverages  Water. Herbal tea. Apple juice. Fats and oils Mild salad dressings. Canola or olive oil. Sweets and desserts Pudding. Custard. Fruit gelatin. Ice cream. The items listed above may not be a complete list of recommended foods and beverages. Contact a dietitian for more options. What foods are not recommended? Grains Whole grain breads and cereals. Vegetables Raw vegetables. Fruits Raw fruits, especially citrus, berries, or dried fruits. Dairy Whole fat dairy foods. Beverages Caffeinated drinks. Alcohol. Seasonings and condiments Strongly flavored seasonings or condiments. Hot sauce. Salsa. Other foods Spicy foods. Fried foods. Sour foods, such as pickled or fermented foods. Foods with high sugar content. Foods high in fiber. The items listed above may not be a complete list of foods and beverages to avoid. Contact a dietitian for more information. Summary  A bland diet consists of foods that are often soft and do not have a lot of fat, fiber, or extra seasonings.  Foods without fat, fiber, or seasoning are easier for the body to digest.  Check with your health care provider to see how long you should follow this diet plan. It is not meant to be followed for long periods. This information is not intended to replace advice given to you by your health care provider. Make sure you discuss any questions you have with your health care provider. Document Released: 01/10/2016 Document Revised: 10/17/2017 Document Reviewed: 10/17/2017 Elsevier Interactive Patient Education  2019  [employer].

## 2018-11-05 ENCOUNTER — Other Ambulatory Visit: Payer: BLUE CROSS/BLUE SHIELD

## 2018-11-05 DIAGNOSIS — R945 Abnormal results of liver function studies: Secondary | ICD-10-CM

## 2018-11-05 DIAGNOSIS — R7989 Other specified abnormal findings of blood chemistry: Secondary | ICD-10-CM

## 2018-11-06 LAB — HEPATITIS PANEL, ACUTE
HEP C AB: NONREACTIVE
Hep A IgM: NONREACTIVE
Hep B C IgM: NONREACTIVE
Hepatitis B Surface Ag: NONREACTIVE
SIGNAL TO CUT-OFF: 0.03 (ref <1.00)

## 2018-11-07 ENCOUNTER — Other Ambulatory Visit: Payer: Self-pay | Admitting: Physician Assistant

## 2018-11-07 DIAGNOSIS — R945 Abnormal results of liver function studies: Principal | ICD-10-CM

## 2018-11-07 DIAGNOSIS — R7989 Other specified abnormal findings of blood chemistry: Secondary | ICD-10-CM

## 2018-11-19 ENCOUNTER — Other Ambulatory Visit (INDEPENDENT_AMBULATORY_CARE_PROVIDER_SITE_OTHER): Payer: BLUE CROSS/BLUE SHIELD

## 2018-11-19 DIAGNOSIS — R945 Abnormal results of liver function studies: Secondary | ICD-10-CM | POA: Diagnosis not present

## 2018-11-19 DIAGNOSIS — R7989 Other specified abnormal findings of blood chemistry: Secondary | ICD-10-CM

## 2018-11-19 LAB — HEPATIC FUNCTION PANEL
ALT: 18 U/L (ref 0–35)
AST: 19 U/L (ref 0–37)
Albumin: 4.4 g/dL (ref 3.5–5.2)
Alkaline Phosphatase: 83 U/L (ref 39–117)
BILIRUBIN DIRECT: 0.1 mg/dL (ref 0.0–0.3)
BILIRUBIN TOTAL: 0.5 mg/dL (ref 0.2–1.2)
Total Protein: 6.6 g/dL (ref 6.0–8.3)

## 2019-01-01 ENCOUNTER — Other Ambulatory Visit: Payer: Self-pay

## 2019-01-01 ENCOUNTER — Encounter: Payer: Self-pay | Admitting: Family Medicine

## 2019-01-01 ENCOUNTER — Ambulatory Visit (INDEPENDENT_AMBULATORY_CARE_PROVIDER_SITE_OTHER): Payer: BLUE CROSS/BLUE SHIELD | Admitting: Family Medicine

## 2019-01-01 VITALS — Ht 69.0 in | Wt 203.0 lb

## 2019-01-01 DIAGNOSIS — H66012 Acute suppurative otitis media with spontaneous rupture of ear drum, left ear: Secondary | ICD-10-CM | POA: Diagnosis not present

## 2019-01-01 MED ORDER — FLUCONAZOLE 150 MG PO TABS: 150.0000 mg | ORAL_TABLET | Freq: Once | ORAL | 0 refills | Status: AC

## 2019-01-01 MED ORDER — AMOXICILLIN 875 MG PO TABS: 875.0000 mg | ORAL_TABLET | Freq: Two times a day (BID) | ORAL | 0 refills | Status: DC

## 2019-01-01 NOTE — Progress Notes (Signed)
I have discussed the procedure for the virtual visit with the patient who has given consent to proceed with assessment and treatment.   BETHANY DILLARD, CMA     

## 2019-01-01 NOTE — Progress Notes (Signed)
   Virtual Visit via Video   I connected with@ on 01/01/19 at  2:30 PM EDT by a video enabled telemedicine application and verified that I am speaking with the correct person using two identifiers. Location patient: Home Location provider: Acupuncturist, Office Persons participating in the virtual visit: pt and myself  I discussed the limitations of evaluation and management by telemedicine and the availability of in person appointments. The patient expressed understanding and agreed to proceed.  Subjective:   HPI:  Ear pain- pt has hx of ear and sinus infection around this time.  L ear pain started 3/17- 'it was horrible'.  Eardrum ruptured on 3/17.  Had a full round of abx- Doxy x7 days.  sxs improved.  Was also using Flonase, Sudafed, Mucinex, Zyrtec, and Neti Pot.  Pt still feels that there is fluid in both ears.  Yesterday again had L ear pain- 'had to lay around on a hot pad'.  This AM pain has improved somewhat but fullness remains.  No known fevers.  ROS: See pertinent positives and negatives per HPI.  Patient Active Problem List   Diagnosis Date Noted  . Anxiety 08/02/2018  . PVC (premature ventricular contraction) 09/06/2017  . Physical exam 07/30/2017  . Obesity (BMI 30.0-34.9) 07/30/2017  . Palpitations 07/30/2017  . History of pulmonary embolus (PE) 02/03/2016  . History of DVT (deep vein thrombosis)   . Dermoid 11/24/2015    Social History   Tobacco Use  . Smoking status: Never Smoker  . Smokeless tobacco: Never Used  Substance Use Topics  . Alcohol use: Yes    Alcohol/week: 4.0 standard drinks    Types: 4 Glasses of wine per week    Current Outpatient Medications:  .  ALPRAZolam (XANAX) 0.5 MG tablet, Take 0.5 mg by mouth 2 (two) times daily as needed for anxiety. , Disp: , Rfl:  .  busPIRone (BUSPAR) 15 MG tablet, TAKE 1 TABLET BY MOUTH TWICE A DAY, Disp: 180 tablet, Rfl: 2 .  Multiple Vitamin (MULTIVITAMIN WITH MINERALS) TABS tablet, Take 1 tablet by  mouth daily., Disp: , Rfl:  .  norethindrone (MICRONOR,CAMILA,ERRIN) 0.35 MG tablet, As directed daily, Disp: , Rfl:  .  propranolol (INDERAL) 20 MG tablet, Take 20 mg by mouth as needed. Reported on 02/09/2016, Disp: , Rfl:  .  valACYclovir (VALTREX) 1000 MG tablet, as needed. , Disp: , Rfl:   No Known Allergies  Objective:   Ht 5\' 9"  (1.753 m)   Wt 203 lb (92.1 kg)   BMI 29.98 kg/m  AAOx3, NAD NCAT, EOMI No obvious CN deficits Coloring WNL Pt is able to speak clearly, coherently without shortness of breath or increased work of breathing.  Thought process is linear.  Mood is appropriate.   Assessment and Plan:   Otitis media w/ spontaneous rupture- new.  Suspect Doxy did not fully treat previous infxn.  Start Amox.  Reviewed supportive care and red flags that should prompt return.  Pt expressed understanding and is in agreement w/ plan.   [name], MD 01/01/2019

## 2019-01-30 ENCOUNTER — Encounter: Payer: Self-pay | Admitting: Family Medicine

## 2019-01-30 ENCOUNTER — Ambulatory Visit (INDEPENDENT_AMBULATORY_CARE_PROVIDER_SITE_OTHER): Payer: BLUE CROSS/BLUE SHIELD | Admitting: Family Medicine

## 2019-01-30 ENCOUNTER — Other Ambulatory Visit: Payer: Self-pay

## 2019-01-30 VITALS — HR 68 | Ht 69.0 in | Wt 200.0 lb

## 2019-01-30 DIAGNOSIS — R300 Dysuria: Secondary | ICD-10-CM | POA: Diagnosis not present

## 2019-01-30 MED ORDER — CEPHALEXIN 500 MG PO CAPS: 500.0000 mg | ORAL_CAPSULE | Freq: Two times a day (BID) | ORAL | 0 refills | Status: DC

## 2019-01-30 NOTE — Progress Notes (Signed)
I have discussed the procedure for the virtual visit with the patient who has given consent to proceed with assessment and treatment.   Took cranberry drops from last UTI, took AZO OTC test and it came back positive for UTI  Gail Foster, CMA

## 2019-01-30 NOTE — Progress Notes (Signed)
   Virtual Visit via Video   I connected with patient on 01/30/19 at  1:20 PM EDT by a video enabled telemedicine application and verified that I am speaking with the correct person using two identifiers.  Location patient: Home Location provider: Acupuncturist, Office Persons participating in the virtual visit: Patient, Provider, [place] (Jess B)  I discussed the limitations of evaluation and management by telemedicine and the availability of in person appointments. The patient expressed understanding and agreed to proceed.  Subjective:   HPI:   ? UTI- sxs started yesterday AM w/ painful urination.  Took leftover cranberry pills w/o relief.  Got OTC AZO test kit and it was + and she was instructed to call PCP.  Increased frequency.  + suprapubic pressure.  Able to empty bladder completely.  No visible blood.  No fevers, no back pain.  ROS:   See pertinent positives and negatives per HPI.  Patient Active Problem List   Diagnosis Date Noted  . Anxiety 08/02/2018  . PVC (premature ventricular contraction) 09/06/2017  . Physical exam 07/30/2017  . Obesity (BMI 30.0-34.9) 07/30/2017  . Palpitations 07/30/2017  . History of pulmonary embolus (PE) 02/03/2016  . History of DVT (deep vein thrombosis)   . Dermoid 11/24/2015    Social History   Tobacco Use  . Smoking status: Never Smoker  . Smokeless tobacco: Never Used  Substance Use Topics  . Alcohol use: Yes    Alcohol/week: 4.0 standard drinks    Types: 4 Glasses of wine per week    Current Outpatient Medications:  .  ALPRAZolam (XANAX) 0.5 MG tablet, Take 0.5 mg by mouth 2 (two) times daily as needed for anxiety. , Disp: , Rfl:  .  busPIRone (BUSPAR) 15 MG tablet, TAKE 1 TABLET BY MOUTH TWICE A DAY, Disp: 180 tablet, Rfl: 2 .  Multiple Vitamin (MULTIVITAMIN WITH MINERALS) TABS tablet, Take 1 tablet by mouth daily., Disp: , Rfl:  .  norethindrone (MICRONOR,CAMILA,ERRIN) 0.35 MG tablet, As directed daily, Disp: , Rfl:  .   valACYclovir (VALTREX) 1000 MG tablet, as needed. , Disp: , Rfl:  .  propranolol (INDERAL) 20 MG tablet, Take 20 mg by mouth as needed. Reported on 02/09/2016, Disp: , Rfl:   No Known Allergies  Objective:   Pulse 68   Ht _0  (1.753 m)   Wt 200 lb (90.7 kg)   BMI 29.53 kg/m   AAOx3, NAD NCAT, EOMI No obvious CN deficits Coloring WNL Pt is able to speak clearly, coherently without shortness of breath or increased work of breathing.  Thought process is linear.  Mood is appropriate.   Assessment and Plan:   Dysuria- new.  Pt's sxs are consistent w/ infxn as is result of at home test.  Start Keflex.  If no improvement will need to do UA and cx.  Pt expressed understanding and is in agreement w/ plan.    [name], MD 01/30/2019

## 2019-01-31 ENCOUNTER — Other Ambulatory Visit: Payer: BLUE CROSS/BLUE SHIELD

## 2019-02-04 ENCOUNTER — Other Ambulatory Visit: Payer: BLUE CROSS/BLUE SHIELD

## 2019-02-04 ENCOUNTER — Other Ambulatory Visit: Payer: Self-pay | Admitting: General Practice

## 2019-02-04 ENCOUNTER — Telehealth (INDEPENDENT_AMBULATORY_CARE_PROVIDER_SITE_OTHER): Payer: BLUE CROSS/BLUE SHIELD | Admitting: Family Medicine

## 2019-02-04 DIAGNOSIS — R300 Dysuria: Secondary | ICD-10-CM

## 2019-02-04 DIAGNOSIS — N390 Urinary tract infection, site not specified: Secondary | ICD-10-CM

## 2019-02-04 LAB — POCT URINALYSIS DIPSTICK
Bilirubin, UA: NEGATIVE
Blood, UA: NEGATIVE
Glucose, UA: NEGATIVE
Ketones, UA: NEGATIVE
Nitrite, UA: NEGATIVE
Protein, UA: NEGATIVE
Spec Grav, UA: <=1.005 — AB (ref 1.010–1.025)
Urobilinogen, UA: 0.2 E.U./dL
pH, UA: 6.5 (ref 5.0–8.0)

## 2019-02-04 MED ORDER — CIPROFLOXACIN HCL 500 MG PO TABS: 500.0000 mg | ORAL_TABLET | Freq: Two times a day (BID) | ORAL | 0 refills | Status: DC

## 2019-02-04 NOTE — Telephone Encounter (Signed)
Pt is scheduled °

## 2019-02-04 NOTE — Telephone Encounter (Signed)
Called and spoke with pt, she is still only having painful urination. States that on Saturday she was feeling better but it worsened on Sunday. She finished the antibiotic today. At home UTI test says that she still has a UTI. Ok for her to come in and leave a urine for testing?

## 2019-02-04 NOTE — Telephone Encounter (Signed)
Ok for UA and culture

## 2019-02-04 NOTE — Telephone Encounter (Signed)
Pt called in stating that she is still having pain when urinating. She wanted to come in to give a Urine sample. Pt can be reached at the home #

## 2019-02-04 NOTE — Telephone Encounter (Signed)
Orders placed.

## 2019-02-04 NOTE — Addendum Note (Signed)
Addended by: Davis Gourd on: 02/04/2019 09:36 AM   Modules accepted: Orders

## 2019-02-04 NOTE — Telephone Encounter (Signed)
She had a VV with Tabori on 01/30/19.

## 2019-02-06 LAB — URINE CULTURE
MICRO NUMBER:: 446589
Result:: NO GROWTH
SPECIMEN QUALITY:: ADEQUATE

## 2019-02-11 DIAGNOSIS — B009 Herpesviral infection, unspecified: Secondary | ICD-10-CM | POA: Diagnosis not present

## 2019-02-11 DIAGNOSIS — Z6830 Body mass index (BMI) 30.0-30.9, adult: Secondary | ICD-10-CM | POA: Diagnosis not present

## 2019-02-11 DIAGNOSIS — Z01419 Encounter for gynecological examination (general) (routine) without abnormal findings: Secondary | ICD-10-CM | POA: Diagnosis not present

## 2019-02-11 DIAGNOSIS — Z3040 Encounter for surveillance of contraceptives, unspecified: Secondary | ICD-10-CM | POA: Diagnosis not present

## 2019-02-20 DIAGNOSIS — M25571 Pain in right ankle and joints of right foot: Secondary | ICD-10-CM | POA: Diagnosis not present

## 2019-07-10 ENCOUNTER — Other Ambulatory Visit: Payer: Self-pay | Admitting: Family Medicine

## 2019-07-16 DIAGNOSIS — R309 Painful micturition, unspecified: Secondary | ICD-10-CM | POA: Diagnosis not present

## 2019-07-16 DIAGNOSIS — N390 Urinary tract infection, site not specified: Secondary | ICD-10-CM | POA: Diagnosis not present

## 2020-03-19 ENCOUNTER — Encounter: Payer: Self-pay | Admitting: Family Medicine

## 2020-03-19 ENCOUNTER — Telehealth (INDEPENDENT_AMBULATORY_CARE_PROVIDER_SITE_OTHER): Payer: BLUE CROSS/BLUE SHIELD | Admitting: Family Medicine

## 2020-03-19 DIAGNOSIS — R21 Rash and other nonspecific skin eruption: Secondary | ICD-10-CM

## 2020-03-19 NOTE — Progress Notes (Signed)
   Virtual Visit via Video   I connected with patient on 03/19/20 at  4:00 PM EDT by a video enabled telemedicine application and verified that I am speaking with the correct person using two identifiers.  Location patient: Home Location provider: Acupuncturist, Office Persons participating in the virtual visit: Patient, Provider  I discussed the limitations of evaluation and management by telemedicine and the availability of in person appointments. The patient expressed understanding and agreed to proceed.  Subjective:   HPI:   Rash- ~1 month ago developed a 'rash around my toes' on R foot.  No pain, didn't itch.  Has recently been in poison oak- applying apple cider vinegar nightly.  Has since used tea tree oil which helped things.  Sister thought it looked like athlete's foot so she started OTC antifungal 'that starts w/ an L' and sxs have mostly resolved.  ~1 week ago 'hands started breaking out'.  Not itchy, not painful.  'very small red bumps, like blisters'  Worse when hot.  No systemic sxs or recent illness  ROS:   See pertinent positives and negatives per HPI.  Patient Active Problem List   Diagnosis Date Noted  . Anxiety 08/02/2018  . PVC (premature ventricular contraction) 09/06/2017  . Physical exam 07/30/2017  . Obesity (BMI 30.0-34.9) 07/30/2017  . Palpitations 07/30/2017  . History of pulmonary embolus (PE) 02/03/2016  . History of DVT (deep vein thrombosis)   . Dermoid 11/24/2015    Social History   Tobacco Use  . Smoking status: Never Smoker  . Smokeless tobacco: Never Used  Substance Use Topics  . Alcohol use: Yes    Alcohol/week: 4.0 standard drinks    Types: 4 Glasses of wine per week    Current Outpatient Medications:  .  ALPRAZolam (XANAX) 0.5 MG tablet, Take 0.5 mg by mouth 2 (two) times daily as needed for anxiety. , Disp: , Rfl:  .  busPIRone (BUSPAR) 15 MG tablet, TAKE 1 TABLET BY MOUTH TWICE A DAY, Disp: 180 tablet, Rfl: 2 .   ciprofloxacin (CIPRO) 500 MG tablet, Take 1 tablet (500 mg total) by mouth 2 (two) times daily., Disp: 6 tablet, Rfl: 0 .  Multiple Vitamin (MULTIVITAMIN WITH MINERALS) TABS tablet, Take 1 tablet by mouth daily., Disp: , Rfl:  .  norethindrone (MICRONOR,CAMILA,ERRIN) 0.35 MG tablet, As directed daily, Disp: , Rfl:  .  propranolol (INDERAL) 20 MG tablet, Take 20 mg by mouth as needed. Reported on 02/09/2016, Disp: , Rfl:  .  valACYclovir (VALTREX) 1000 MG tablet, as needed. , Disp: , Rfl:   No Known Allergies  Objective:   There were no vitals taken for this visit.  AAOx3, NAD NCAT, EOMI No obvious CN deficits Coloring WNL Pt is able to speak clearly, coherently without shortness of breath or increased work of breathing.  Not able to see lesions on hands/feet due to poor camera quality/bad connection Thought process is linear.  Mood is appropriate.   Assessment and Plan:   Rash- suspect she has 2 different things going on.  The rash on her feet sounds consistent w/ fungal dermatitis.  Is is responding well to OTC antifungal cream.  Encouraged her to continue.  The vesicles on her hands are more consistent w/ dyshidrotic eczema.  Encouraged hydrocortisone or moisturizing.  If no improvement, will need OV to assess.  Pt expressed understanding and is in agreement w/ plan.    [name], MD 03/19/2020

## 2020-05-05 ENCOUNTER — Other Ambulatory Visit: Payer: Self-pay | Admitting: Family Medicine

## 2020-06-21 ENCOUNTER — Telehealth: Payer: BLUE CROSS/BLUE SHIELD | Admitting: Family

## 2020-06-21 DIAGNOSIS — Z20822 Contact with and (suspected) exposure to covid-19: Secondary | ICD-10-CM

## 2020-06-21 DIAGNOSIS — M7989 Other specified soft tissue disorders: Secondary | ICD-10-CM

## 2020-06-21 MED ORDER — ALBUTEROL SULFATE HFA 108 (90 BASE) MCG/ACT IN AERS: 2.0000 | INHALATION_SPRAY | Freq: Four times a day (QID) | RESPIRATORY_TRACT | 0 refills | Status: DC | PRN

## 2020-06-21 MED ORDER — BENZONATATE 100 MG PO CAPS: 100.0000 mg | ORAL_CAPSULE | Freq: Three times a day (TID) | ORAL | 0 refills | Status: DC | PRN

## 2020-06-21 NOTE — Progress Notes (Signed)
Based on what you shared with me, I feel your condition warrants further evaluation and I recommend that you be seen for a face to face office visit.  Given you are having leg swelling, this is not a common symptom of COVID and need to be seen face to face to rule out a more serious complication.    NOTE: If you entered your credit card information for this eVisit, you will not be charged. You may see a "hold" on your card for the $35 but that hold will drop off and you will not have a charge processed.   If you are having a true medical emergency please call 911.      For an urgent face to face visit, [place] has five urgent care centers for your convenience:      NEW:  [facility] [place] Get Driving Directions [phone] [place], [place] [address]  10 am - 6pm Monday - Friday    [facility]) Get Driving Directions [phone] [place], [place] [address]  10 am to 8 pm Monday-Friday  12 pm to 8 pm [facility] Get Driving Directions [phone] [address] [place], [place], [place] [address]  8 am to 8 pm Monday-Friday  9 am to 6 pm Saturday  11 am to 6 pm Sunday     [facility] Get Driving Directions  [phone] [address] [place], [place] [address]  8 am to 8 pm Monday-Friday  8 am to 4 pm [facility] at [place] Get Driving Directions [phone] [address] [place], [place] [address]  12 pm to 6 pm Monday-Friday      Your e-visit answers were reviewed by a board certified advanced clinical practitioner to complete your personal care plan.  Thank you for using e-Visits.

## 2020-06-21 NOTE — Progress Notes (Signed)
E-Visit for Corona Virus Screening  Your current symptoms could be consistent with the coronavirus.  Many health care providers can now test patients at their office but not all are.  [place] has multiple testing sites. For information on our COVID testing locations and hours go to [email or web address].  If you are not having leg swelling at this time, see below our treatment plan.   We are enrolling you in our [place] for [place] . Daily you will receive a questionnaire within the [place] website. Our COVID 19 response team will be monitoring your responses daily.  Testing Information: The COVID-19 Community Testing sites are testing BY APPOINTMENT ONLY.  You can schedule online at [email or web address]  If you do not have access to a smart phone or computer you may call [phone] for an appointment.   Additional testing sites in the Community:  . For [facility] Testing sites in [facility]  FaceUpdate.uy  . For Pop-up testing sites in [place]  BowlDirectory.co.uk  . For [facility] [email or web address]  . For [facility] testing in [place] and [employer] [email or web address]  . For [facility] testing in [facility]   [email or web address]  For  more information about community testing call [phone]   Please quarantine yourself while awaiting your test results. Please stay home for a minimum of 10 days from the first day of illness with improving symptoms and you have had 24 hours of no fever (without the use of Tylenol (Acetaminophen) Motrin (Ibuprofen) or any fever reducing medication).  Also - Do not get tested prior  to returning to work because once you have had a positive test the test can stay positive for more than a month in some cases.   You should wear a mask or cloth face covering over your nose and mouth if you must be around other people or animals, including pets (even at home). Try to stay at least 6 feet away from other people. This will protect the people around you.  Please continue good preventive care measures, including:  frequent hand-washing, avoid touching your face, cover coughs/sneezes, stay out of crowds and keep a 6 foot distance from others.  COVID-19 is a respiratory illness with symptoms that are similar to the flu. Symptoms are typically mild to moderate, but there have been cases of severe illness and death due to the virus.   The following symptoms may appear 2-14 days after exposure: . Fever . Cough . Shortness of breath or difficulty breathing . Chills . Repeated shaking with chills . Muscle pain . Headache . Sore throat . New loss of taste or smell . Fatigue . Congestion or runny nose . Nausea or vomiting . Diarrhea  Go to the nearest hospital ED for assessment if fever/cough/breathlessness are severe or illness seems like a threat to life.  It is vitally important that if you feel that you have an infection such as this virus or any other virus that you stay home and away from places where you may spread it to others.  You should avoid contact with people age 36 and older.   You can use medication such as A prescription cough medication called Tessalon Perles 100 mg. You may take 1-2 capsules every 8 hours as needed for cough and A prescription inhaler called Albuterol MDI 90 mcg /actuation 2 puffs every 4 hours as needed for shortness of breath, wheezing, cough.  You may also take acetaminophen (Tylenol) as needed for fever.  Reduce your  risk of any infection by using the same precautions used for avoiding the common cold or flu:  Marland Kitchen Wash your hands often with soap and  warm water for at least 20 seconds.  If soap and water are not readily available, use an alcohol-based hand sanitizer with at least 60% alcohol.  . If coughing or sneezing, cover your mouth and nose by coughing or sneezing into the elbow areas of your shirt or coat, into a tissue or into your sleeve (not your hands). . Avoid shaking hands with others and consider head nods or verbal greetings only. . Avoid touching your eyes, nose, or mouth with unwashed hands.  . Avoid close contact with people who are sick. . Avoid places or events with large numbers of people in one location, like concerts or sporting events. . Carefully consider travel plans you have or are making. . If you are planning any travel outside or inside the [place], visit the CDC's Travelers' Health webpage for the latest health notices. . If you have some symptoms but not all symptoms, continue to monitor at home and seek medical attention if your symptoms worsen. . If you are having a medical emergency, call 911.  HOME CARE . Only take medications as instructed by your medical team. . Drink plenty of fluids and get plenty of rest. . A steam or ultrasonic humidifier can help if you have congestion.   GET HELP RIGHT AWAY IF YOU HAVE EMERGENCY WARNING SIGNS** FOR COVID-19. If you or someone is showing any of these signs seek emergency medical care immediately. Call 911 or proceed to your closest emergency facility if: . You develop worsening high fever. . Trouble breathing . Bluish lips or face . Persistent pain or pressure in the chest . New confusion . Inability to wake or stay awake . You cough up blood. . Your symptoms become more severe  **This list is not all possible symptoms. Contact your medical provider for any symptoms that are sever or concerning to you.  MAKE SURE YOU   Understand these instructions.  Will watch your condition.  Will get help right away if you are not doing well or get worse.  Your e-visit  answers were reviewed by a board certified advanced clinical practitioner to complete your personal care plan.  Depending on the condition, your plan could have included both over the counter or prescription medications.  If there is a problem please reply once you have received a response from your provider.  Your safety is important to [place].  If you have drug allergies check your prescription carefully.    You can use MyChart to ask questions about today's visit, request a non-urgent call back, or ask for a work or school excuse for 24 hours related to this e-Visit. If it has been greater than 24 hours you will need to follow up with your provider, or enter a new e-Visit to address those concerns. You will get an e-mail in the next two days asking about your experience.  I Delila that your e-visit has been valuable and will speed your recovery. Thank you for using e-visits.   Approximately 5 minutes was spent documenting and reviewing patient's chart.

## 2020-06-25 ENCOUNTER — Other Ambulatory Visit: Payer: Self-pay

## 2020-06-25 ENCOUNTER — Encounter: Payer: Self-pay | Admitting: Family Medicine

## 2020-06-25 ENCOUNTER — Telehealth (INDEPENDENT_AMBULATORY_CARE_PROVIDER_SITE_OTHER): Payer: BLUE CROSS/BLUE SHIELD | Admitting: Family Medicine

## 2020-06-25 VITALS — Ht 69.0 in | Wt 200.0 lb

## 2020-06-25 DIAGNOSIS — J069 Acute upper respiratory infection, unspecified: Secondary | ICD-10-CM | POA: Diagnosis not present

## 2020-06-25 MED ORDER — PREDNISONE 10 MG PO TABS: ORAL_TABLET | ORAL | 0 refills | Status: DC

## 2020-06-25 NOTE — Progress Notes (Signed)
Patient: Gail Foster MRN: [id number] DOB: 11-27-1985 PCP: Midge Minium, MD     I connected with Rico Sheehan on 06/25/20 at @CHLAPPTIME @ by a video enabled telemedicine application and verified that I am speaking with the correct person using two identifiers.  Location patient: Home Location provider: [place] [facility], Office Persons participating in this virtual visit: pt, CMA (Mellita C), Alver Fisher, MD  I discussed the limitations of evaluation and management by telemedicine and the availability of in person appointments. The patient expressed understanding and agreed to proceed.   Subjective:  No chief complaint on file.   HPI: The patient is a 34 y.o. female who presents today for possible sinus infection. She begin to have symptoms last Thursday.  Symptoms include drainage, fatigue, extremely tired. She suspected environmental Allergies, but had migraine and fever of 100.2. Her Covid test is negative. She stated feeling better Monday. She still; has a slight cough due to drainage. Pt is still not 100 percent. She has taken Mucinex, Sudafed, Zyrtec.   'allergies or a head cold or both'- was traveling Mon-Wed and then Thursday developed PND.  Slept well Thursday night.  Friday drainage was worse and she was 'getting up quite a bit of stuff in the morning'.  Stayed in over the weekend.  Monday woke up feeling 'awful'.  Had a migraine Monday, temp 100.2.  Got COVID test on Wednesday- negative.  Reports she started feeling better Monday once migraine lifted.  No facial pain/pressure.  'I can feel the constant drainage'.  Taking Zyrtec daily and Flonase.  Added Mucinex.  Review of Systems For ROS see HPI   Allergies Patient has No Known Allergies.  Past Medical History Patient  has a past medical history of Anemia, Anxiety, Chronic lower back pain, DVT (deep venous thrombosis) ([place]) (02/03/2016), Leg cramping, Lumbar herniated disc, and Pulmonary embolism ([place]) (02/03/2016).  Surgical  History Patient  has a past surgical history that includes laparotomy (Right, 11/24/2015) and Dermoid cyst  excision (Right, 11/24/15).  Family History Pateint's family history includes Atrial fibrillation in her father and paternal grandmother; Heart disease in her paternal grandmother; Hypertension in her father.  Social History Patient  reports that she has never smoked. She has never used smokeless tobacco. She reports current alcohol use of about 4.0 standard drinks of alcohol per week. She reports that she does not use drugs.    Objective: There were no vitals filed for this visit.  There is no height or weight on file to calculate BMI.  Physical Exam AAOx3, NAD NCAT, EOMI No obvious CN deficits Coloring WNL Pt is able to speak clearly, coherently without shortness of breath or increased work of breathing.  Thought process is linear.  Mood is appropriate.     Assessment/plan: Viral URI- new.  Pt does not appear to have bacterial infxn and reports she is feeling well w/ exception of constant PND.  Will start Pred taper to improve congestion and inflammation.  If no improvement in sxs will add Singulair at night.  Pt expressed understanding and is in agreement w/ plan.   [name], MD     No follow-ups on file.     Tobe Sos, MD [place]  06/25/2020

## 2020-06-29 ENCOUNTER — Encounter: Payer: Self-pay | Admitting: Family Medicine

## 2020-06-29 ENCOUNTER — Other Ambulatory Visit: Payer: Self-pay | Admitting: General Practice

## 2020-06-29 MED ORDER — AMOXICILLIN 875 MG PO TABS: 875.0000 mg | ORAL_TABLET | Freq: Two times a day (BID) | ORAL | 0 refills | Status: DC

## 2020-07-18 ENCOUNTER — Other Ambulatory Visit: Payer: Self-pay | Admitting: Family

## 2020-07-18 DIAGNOSIS — Z20822 Contact with and (suspected) exposure to covid-19: Secondary | ICD-10-CM

## 2020-07-19 ENCOUNTER — Other Ambulatory Visit: Payer: Self-pay | Admitting: Family Medicine

## 2020-07-19 DIAGNOSIS — Z20822 Contact with and (suspected) exposure to covid-19: Secondary | ICD-10-CM

## 2020-10-06 ENCOUNTER — Encounter: Payer: Self-pay | Admitting: Family Medicine

## 2020-10-07 ENCOUNTER — Encounter: Payer: Self-pay | Admitting: Family Medicine

## 2020-10-07 ENCOUNTER — Other Ambulatory Visit: Payer: Self-pay

## 2020-10-07 ENCOUNTER — Ambulatory Visit (INDEPENDENT_AMBULATORY_CARE_PROVIDER_SITE_OTHER): Payer: BC Managed Care – PPO | Admitting: Family Medicine

## 2020-10-07 ENCOUNTER — Other Ambulatory Visit: Payer: Self-pay | Admitting: Family Medicine

## 2020-10-07 ENCOUNTER — Ambulatory Visit (HOSPITAL_COMMUNITY)
Admission: RE | Admit: 2020-10-07 | Discharge: 2020-10-07 | Disposition: A | Discharge status: Home / Self Care | Payer: BC Managed Care – PPO | Source: Ambulatory Visit | Attending: Cardiovascular Disease | Admitting: Cardiovascular Disease

## 2020-10-07 VITALS — BP 129/85 | HR 94 | Temp 98.3°F | Resp 18 | Ht 69.0 in | Wt 222.8 lb

## 2020-10-07 DIAGNOSIS — M79662 Pain in left lower leg: Secondary | ICD-10-CM | POA: Insufficient documentation

## 2020-10-07 DIAGNOSIS — Z86718 Personal history of other venous thrombosis and embolism: Secondary | ICD-10-CM

## 2020-10-07 MED ORDER — RIVAROXABAN (XARELTO) VTE STARTER PACK (15 & 20 MG): ORAL_TABLET | ORAL | 0 refills | Status: DC

## 2020-10-07 NOTE — Patient Instructions (Addendum)
Start xarelto 15 mg twice daily for 3 weeks- try to get first dose in as soon as you can and then perhaps right before bed. If any issues, please call our office- we definitely want you on this even pending the ultrasound due to your history  Sit tight- team is going to get you scheduled for this today  Go ahead and schedule visit with Dr. Beverely Low in 3-4 weeks to discuss next steps. If for some reason had trouble with refill- I am more than happy to help with this- please do not let this run out- you will be on 20 mg daily after first 3 weeks  Recommended follow up: seek care if new or worsening symptoms particularly shortness of breath

## 2020-10-07 NOTE — Progress Notes (Signed)
Phone [phone] In person visit   Subjective:   Gail Foster is a 35 y.o. year old very pleasant female patient who presents for/with See problem oriented charting Chief Complaint  Patient presents with  . Possible DVT    This visit occurred during the SARS-CoV-2 public health emergency.  Safety protocols were in place, including screening questions prior to the visit, additional usage of staff PPE, and extensive cleaning of exam room while observing appropriate contact time as indicated for disinfecting solutions.   Past Medical History-  Patient Active Problem List   Diagnosis Date Noted  . Anxiety 08/02/2018  . PVC (premature ventricular contraction) 09/06/2017  . Physical exam 07/30/2017  . Obesity (BMI 30.0-34.9) 07/30/2017  . Palpitations 07/30/2017  . History of pulmonary embolus (PE) 02/03/2016  . History of DVT (deep vein thrombosis)   . Dermoid 11/24/2015    Medications- reviewed and updated Current Outpatient Medications  Medication Sig Dispense Refill  . albuterol (VENTOLIN HFA) 108 (90 Base) MCG/ACT inhaler INHALE 2 PUFFS INTO THE LUNGS EVERY 6 HOURS AS NEEDED FOR WHEEEZING/SHORTNESS OF BREATH 6.7 each 3  . ALPRAZolam (XANAX) 0.5 MG tablet Take 0.5 mg by mouth 2 (two) times daily as needed for anxiety.     Marland Kitchen amphetamine-dextroamphetamine (ADDERALL) 20 MG tablet Take 20 mg by mouth 3 (three) times daily.    . busPIRone (BUSPAR) 15 MG tablet TAKE 1 TABLET BY MOUTH TWICE A DAY 180 tablet 2  . Multiple Vitamin (MULTIVITAMIN WITH MINERALS) TABS tablet Take 1 tablet by mouth daily.    . norethindrone (MICRONOR,CAMILA,ERRIN) 0.35 MG tablet As directed daily    . propranolol (INDERAL) 20 MG tablet Take 20 mg by mouth as needed. Reported on 02/09/2016    . valACYclovir (VALTREX) 1000 MG tablet as needed.      No current facility-administered medications for this visit.     Objective:  BP 129/85   Pulse 94   Temp 98.3 F (36.8 C) (Temporal)   Resp 18   Ht 5\' 9"   (1.753 m)   Wt 222 lb 12.8 oz (101.1 kg)   SpO2 99%   BMI 32.90 kg/m  Gen: NAD, resting comfortably CV: RRR no murmurs rubs or gallops Lungs: CTAB no crackles, wheeze, rhonchi Ext: no edema no patient does have some left lateral calf pain tenderness on the left lower leg    Assessment and Plan  #Left calf pain in patient with history of provoked DVT S: Patient with history of infrapopliteal DVT as well as pulmonary embolism Feb 13, 2016.  Treated with anticoagulation-for at least 3 to 6 months past October 2017 visit.  Resolution of DVT on June 2018 venous duplex.  It was thought to be provoked at that time as patient had dermoid cyst surgery and was on OCPs at that time.  Patient stated that the pain in her left leg came back earlyDecember- perhaps a vague pain in back of leg maybe at night but thought maybe she was sitting poorly- or possible muscle spasm. Started an aspirin a day mid December 81mg .   Pain has intensified overtime and become more constant. Never had swelling/heat/redness with first DVT until night before hospital. No pain in right leg. Pain in upper left lateral calf- 5/10 constant  She has been doing a fair amount of travel to [place]- does not pump calves or wear compression stockings on plane.   Took xarelto 20mg  at dinner last night.   No worsening shortness of breath. no chest pain.  Recovering from covid week before christmas- did have some lingering perhaps mild windedness from covid.   A/P: 34 year old female with history of DVT in 2017 as well as pulmonary embolism thought to be provoked by her prior surgery and OCP use now presenting with left calf pain.  Patient recently had [id number] and this can certainly increase risk of DVT plus has had many plane flights back and forth to [place].  I think there is a high probability of recurrent DVT-stat ultrasound set up for this afternoon and we placed patient on Xarelto starter pack-she did take a 20 mg dose of Xarelto last  night but I want her to go ahead and start with 15 mg sooner she can pick this up and then another 15 mg before bed.  Asked patient to schedule 3 to 4-week follow-up with Dr. Birdie Riddle but stressed importance of not running out of medication-I am happy to fill 20 mg if needed to help bridge her to a visit.  All of this is under strong assumption that venous duplex will show recurrent DVT  We also discussed possible hematology consult to determine if patient needs additional blood work or long-term anticoagulation  Patient has had no worsening shortness of breath and we did not opt to pursue CT angiogram-treatment for pulmonary embolism even if present would be the same most likely to treatment provided above  Recommended follow up: As needed for new or worsening symptoms Lab/Order associations:   ICD-10-CM   1. Pain of left calf  M79.662 VAS [place] LOWER EXTREMITY VENOUS (DVT)    Meds ordered this encounter  Medications  . RIVAROXABAN (XARELTO) VTE STARTER PACK (15 & 20 MG)    Sig: Follow package directions: Take one 15mg  tablet by mouth twice a day. On day 22, switch to one 20mg  tablet once a day. Take with food.    Dispense:  51 each    Refill:  0   Time Spent: 36 minutes of total time (11:39 AM- 12:15 PM) was spent on the date of the encounter performing the following actions: chart review prior to seeing the patient, obtaining history, performing a medically necessary exam, counseling on the treatment plan, placing orders, and documenting in our EHR.   Return precautions advised.  Garret Reddish, MD

## 2021-01-14 ENCOUNTER — Telehealth: Payer: BC Managed Care – PPO | Admitting: Emergency Medicine

## 2021-01-14 DIAGNOSIS — J329 Chronic sinusitis, unspecified: Secondary | ICD-10-CM | POA: Diagnosis not present

## 2021-01-14 MED ORDER — AMOXICILLIN-POT CLAVULANATE 875-125 MG PO TABS: 1.0000 | ORAL_TABLET | Freq: Two times a day (BID) | ORAL | 0 refills | Status: DC

## 2021-01-14 NOTE — Progress Notes (Signed)
We are sorry that you are not feeling well.  Here is how we plan to help!  Based on what you have shared with me it looks like you have sinusitis.  Sinusitis is inflammation and infection in the sinus cavities of the head.  Based on your presentation I believe you most likely have Acute Bacterial Sinusitis.  This is an infection caused by bacteria and is treated with antibiotics. I have prescribed Augmentin 875mg/125mg one tablet twice daily with food, for 7 days. You may use an oral decongestant such as Mucinex D or if you have glaucoma or high blood pressure use plain Mucinex. Saline nasal spray help and can safely be used as often as needed for congestion.  If you develop worsening sinus pain, fever or notice severe headache and vision changes, or if symptoms are not better after completion of antibiotic, please schedule an appointment with a health care provider.    Sinus infections are not as easily transmitted as other respiratory infection, however we still recommend that you avoid close contact with loved ones, especially the very young and elderly.  Remember to wash your hands thoroughly throughout the day as this is the number one way to prevent the spread of infection!  Home Care:  Only take medications as instructed by your medical team.  Complete the entire course of an antibiotic.  Do not take these medications with alcohol.  A steam or ultrasonic humidifier can help congestion.  You can place a towel over your head and breathe in the steam from hot water coming from a faucet.  Avoid close contacts especially the very young and the elderly.  Cover your mouth when you cough or sneeze.  Always remember to wash your hands.  Get Help Right Away If:  You develop worsening fever or sinus pain.  You develop a severe head ache or visual changes.  Your symptoms persist after you have completed your treatment plan.  Make sure you  Understand these instructions.  Will watch your  condition.  Will get help right away if you are not doing well or get worse.  Your e-visit answers were reviewed by a board certified advanced clinical practitioner to complete your personal care plan.  Depending on the condition, your plan could have included both over the counter or prescription medications.  If there is a problem please reply  once you have received a response from your provider.  Your safety is important to us.  If you have drug allergies check your prescription carefully.    You can use MyChart to ask questions about today's visit, request a non-urgent call back, or ask for a work or school excuse for 24 hours related to this e-Visit. If it has been greater than 24 hours you will need to follow up with your provider, or enter a new e-Visit to address those concerns.  You will get an e-mail in the next two days asking about your experience.  I Raimi that your e-visit has been valuable and will speed your recovery. Thank you for using e-visits.   Approximately 5 minutes was used in reviewing the patient's chart, questionnaire, prescribing medications, and documentation.  

## 2021-03-30 ENCOUNTER — Encounter: Payer: Self-pay | Admitting: *Deleted

## 2021-11-16 ENCOUNTER — Ambulatory Visit (INDEPENDENT_AMBULATORY_CARE_PROVIDER_SITE_OTHER): Payer: Managed Care, Other (non HMO) | Admitting: Registered Nurse

## 2021-11-16 ENCOUNTER — Ambulatory Visit
Admission: RE | Admit: 2021-11-16 | Discharge: 2021-11-16 | Disposition: A | Discharge status: Home / Self Care | Payer: Managed Care, Other (non HMO) | Source: Ambulatory Visit | Attending: Registered Nurse | Admitting: Registered Nurse

## 2021-11-16 ENCOUNTER — Encounter: Payer: Self-pay | Admitting: Registered Nurse

## 2021-11-16 ENCOUNTER — Other Ambulatory Visit: Payer: Self-pay | Admitting: Registered Nurse

## 2021-11-16 ENCOUNTER — Other Ambulatory Visit: Payer: Self-pay

## 2021-11-16 VITALS — BP 121/73 | HR 74 | Temp 98.2°F | Resp 18 | Ht 69.0 in | Wt 220.0 lb

## 2021-11-16 DIAGNOSIS — R0781 Pleurodynia: Secondary | ICD-10-CM

## 2021-11-16 MED ORDER — DICLOFENAC SODIUM 75 MG PO TBEC: 75.0000 mg | DELAYED_RELEASE_TABLET | Freq: Two times a day (BID) | ORAL | 0 refills | Status: AC

## 2021-11-16 MED ORDER — TRAMADOL HCL 50 MG PO TABS: 50.0000 mg | ORAL_TABLET | Freq: Three times a day (TID) | ORAL | 0 refills | Status: AC | PRN

## 2021-11-16 MED ORDER — METHOCARBAMOL 500 MG PO TABS: 500.0000 mg | ORAL_TABLET | Freq: Four times a day (QID) | ORAL | 0 refills | Status: AC | PRN

## 2021-11-16 NOTE — Progress Notes (Signed)
Established Patient Office Visit  Subjective:  Patient ID: Gail Foster, female    DOB: 07-21-1986  Age: 36 y.o. MRN: [id number]  CC:  Chief Complaint  Patient presents with   Pain    Patient states she has been having some right rib pain when working out over a week ago.she took some aleve and ibuprofen with no relief.    HPI [facility] presents for r side rib pain  Onset one week ago while working out. Has taken OTC analgesics without relief No skin changes or rash No shob, doe, chest pain, cough, congestion. Does note that pain worsens with deep breathing.   Has tried to stretch and roll   Pain waxes and wanes.   Notes she feels something 'popped' on Monday. Pain all day  She has been able to continue being active, but activity causes more pain.   Did have a sports massage yesterday, pain has worsened instead of getting better afterwards.   Past Medical History:  Diagnosis Date   Anemia    history of   Anxiety    Chronic lower back pain    DVT (deep venous thrombosis) ([place]) 02/03/2016   LLE   Leg cramping    LLE, "calf, behind knee" (02/03/2016)   Lumbar herniated disc    Pulmonary embolism ([place]) 02/03/2016   bilateral PE, R > L    Past Surgical History:  Procedure Laterality Date   DERMOID CYST  EXCISION Right 11/24/15   LAPAROTOMY Right 11/24/2015   Procedure: LAPAROTOMY RIGHT OVARIAN CYSTECTOMY;  Surgeon: Marylynn Pearson, MD;  Location: [facility];  Service: Gynecology;  Laterality: Right;    Family History  Problem Relation Age of Onset   Atrial fibrillation Father    Hypertension Father    Atrial fibrillation Paternal Grandmother    Heart disease Paternal Grandmother     Social History   Socioeconomic History   Marital status: Single    Spouse name: Not on file   Number of children: Not on file   Years of education: Not on file   Highest education level: Not on file  Occupational History   Not on file  Tobacco Use   Smoking status: Never    Smokeless tobacco: Never  Vaping Use   Vaping Use: Never used  Substance and Sexual Activity   Alcohol use: Yes    Alcohol/week: 4.0 standard drinks    Types: 4 Glasses of wine per week   Drug use: No   Sexual activity: Yes    Birth control/protection: Pill  Other Topics Concern   Not on file  Social History Narrative   Not on file   Social Determinants of Health   Financial Resource Strain: Not on file  Food Insecurity: Not on file  Transportation Needs: Not on file  Physical Activity: Not on file  Stress: Not on file  Social Connections: Not on file  Intimate Partner Violence: Not on file    Outpatient Medications Prior to Visit  Medication Sig Dispense Refill   ALPRAZolam (XANAX) 0.5 MG tablet Take 0.5 mg by mouth 2 (two) times daily as needed for anxiety.      amphetamine-dextroamphetamine (ADDERALL) 20 MG tablet Take 20 mg by mouth 3 (three) times daily.     busPIRone (BUSPAR) 15 MG tablet TAKE 1 TABLET BY MOUTH TWICE A DAY 180 tablet 2   Multiple Vitamin (MULTIVITAMIN WITH MINERALS) TABS tablet Take 1 tablet by mouth daily.     norethindrone (MICRONOR,CAMILA,ERRIN) 0.35  MG tablet As directed daily     propranolol (INDERAL) 20 MG tablet Take 20 mg by mouth as needed. Reported on 02/09/2016     valACYclovir (VALTREX) 1000 MG tablet as needed.      albuterol (VENTOLIN HFA) 108 (90 Base) MCG/ACT inhaler INHALE 2 PUFFS INTO THE LUNGS EVERY 6 HOURS AS NEEDED FOR WHEEEZING/SHORTNESS OF BREATH 6.7 each 3   amoxicillin-clavulanate (AUGMENTIN) 875-125 MG tablet Take 1 tablet by mouth every 12 (twelve) hours. 14 tablet 0   No facility-administered medications prior to visit.    No Known Allergies  ROS Review of Systems  Constitutional: Negative.   HENT: Negative.    Eyes: Negative.   Respiratory: Negative.    Cardiovascular: Negative.   Gastrointestinal: Negative.   Genitourinary: Negative.   Musculoskeletal:  Positive for myalgias.  Skin: Negative.   Neurological:  Negative.   Psychiatric/Behavioral: Negative.    All other systems reviewed and are negative.    Objective:    Physical Exam Vitals and nursing note reviewed.  Constitutional:      General: She is not in acute distress.    Appearance: Normal appearance. She is normal weight. She is not ill-appearing, toxic-appearing or diaphoretic.  Cardiovascular:     Rate and Rhythm: Normal rate and regular rhythm.     Heart sounds: Normal heart sounds. No murmur heard.   No friction rub. No gallop.  Pulmonary:     Effort: Pulmonary effort is normal. No respiratory distress.     Breath sounds: Normal breath sounds. No stridor. No wheezing, rhonchi or rales.  Chest:     Chest wall: No tenderness.  Musculoskeletal:        General: Tenderness (R side ribs, back and wrapping under arm) present. No swelling, deformity or signs of injury. Normal range of motion.     Right lower leg: No edema.     Left lower leg: No edema.  Skin:    General: Skin is warm and dry.     Findings: No lesion or rash.  Neurological:     General: No focal deficit present.     Mental Status: She is alert and oriented to person, place, and time. Mental status is at baseline.  Psychiatric:        Mood and Affect: Mood normal.        Behavior: Behavior normal.        Thought Content: Thought content normal.        Judgment: Judgment normal.    BP 121/73    Pulse 74    Temp 98.2 F (36.8 C) (Temporal)    Resp 18    Ht 5\' 9"  (1.753 m)    Wt 220 lb (99.8 kg)    SpO2 100%    BMI 32.49 kg/m  Wt Readings from Last 3 Encounters:  11/16/21 220 lb (99.8 kg)  10/07/20 222 lb 12.8 oz (101.1 kg)  06/25/20 200 lb (90.7 kg)     Health Maintenance Due  Topic Date Due   COVID-19 Vaccine (1) Never done   HIV Screening  Never done   PAP SMEAR-Modifier  10/02/2020   INFLUENZA VACCINE  05/02/2021    There are no preventive care reminders to display for this patient.  Lab Results  Component Value Date   TSH 1.25 08/02/2018    Lab Results  Component Value Date   WBC 6.4 11/04/2018   HGB 14.9 11/04/2018   HCT 43.5 11/04/2018   MCV 95.7 11/04/2018   PLT  223.0 11/04/2018   Lab Results  Component Value Date   NA 139 11/04/2018   K 4.3 11/04/2018   CO2 26 11/04/2018   GLUCOSE 94 11/04/2018   BUN 8 11/04/2018   CREATININE 0.73 11/04/2018   BILITOT 0.5 11/19/2018   ALKPHOS 83 11/19/2018   AST 19 11/19/2018   ALT 18 11/19/2018   PROT 6.6 11/19/2018   ALBUMIN 4.4 11/19/2018   CALCIUM 9.5 11/04/2018   ANIONGAP 8 08/29/2016   GFR 91.59 11/04/2018   Lab Results  Component Value Date   CHOL 170 08/02/2018   Lab Results  Component Value Date   HDL 48.40 08/02/2018   Lab Results  Component Value Date   LDLCALC 106 (H) 08/02/2018   Lab Results  Component Value Date   TRIG 77.0 08/02/2018   Lab Results  Component Value Date   CHOLHDL 4 08/02/2018   No results found for: HGBA1C    Assessment & Plan:   Problem List Items Addressed This Visit   None Visit Diagnoses     Rib pain    -  Primary   Relevant Medications   diclofenac (VOLTAREN) 75 MG EC tablet   methocarbamol (ROBAXIN) 500 MG tablet   traMADol (ULTRAM) 50 MG tablet   Other Relevant Orders   DG Chest 2 View       Meds ordered this encounter  Medications   diclofenac (VOLTAREN) 75 MG EC tablet    Sig: Take 1 tablet (75 mg total) by mouth 2 (two) times daily.    Dispense:  30 tablet    Refill:  0    Order Specific Question:   Supervising Provider    Answer:   Carlota Raspberry, JEFFREY R [[id number]]   methocarbamol (ROBAXIN) 500 MG tablet    Sig: Take 1 tablet (500 mg total) by mouth 4 (four) times daily as needed for muscle spasms.    Dispense:  60 tablet    Refill:  0    Order Specific Question:   Supervising Provider    Answer:   Carlota Raspberry, JEFFREY R [[id number]]   traMADol (ULTRAM) 50 MG tablet    Sig: Take 1 tablet (50 mg total) by mouth every 8 (eight) hours as needed for up to 5 days.    Dispense:  15 tablet    Refill:  0    Order  Specific Question:   Supervising Provider    Answer:   Carlota Raspberry, JEFFREY R [[id number]]    Follow-up: Return if symptoms worsen or fail to improve.   PLAN MSK is apparent etiology, but unsure of rib injury given pop felt. Will order xray. Diclofenac and methocarbamol for pain.  Tramadol for breakthrough pain .discussed risks, benefits, side effects, and alternatives to medications. Pt voices understanding Patient encouraged to call clinic with any questions, comments, or concerns.  Maximiano Coss, NP

## 2021-11-16 NOTE — Patient Instructions (Addendum)
Ms. Gail Foster to see you  Sorry you're in pain  I'm guessing mostly muscular, but let's rule out rib involvment with xray. Head to [facility] for chest xray. I'll let you know how results look  I have sent diclofenac and methocarbamol for relief, tramadol for breakthrough pain. Ok to pair these with tylenol if needed  Safe travels!  Thanks,  Rich     If you have lab work done today you will be contacted with your lab results within the next 2 weeks.  If you have not heard from [place] then please contact us. The fastest way to get your results is to register for My Chart.   IF you received an x-ray today, you will receive an invoice from [facility]. Please contact [facility] at [phone] with questions or concerns regarding your invoice.   IF you received labwork today, you will receive an invoice from [facility]. Please contact [facility] at [phone] with questions or concerns regarding your invoice.   Our billing staff will not be able to assist you with questions regarding bills from these companies.  You will be contacted with the lab results as soon as they are available. The fastest way to get your results is to activate your My Chart account. Instructions are located on the last page of this paperwork. If you have not heard from [place] regarding the results in 2 weeks, please contact this office.

## 2021-11-16 NOTE — Addendum Note (Signed)
Addended by: Maximiano Coss on: 11/16/2021 12:00 PM   Modules accepted: Orders

## 2021-12-19 ENCOUNTER — Other Ambulatory Visit: Payer: Self-pay | Admitting: Obstetrics and Gynecology

## 2021-12-19 DIAGNOSIS — R1011 Right upper quadrant pain: Secondary | ICD-10-CM

## 2021-12-20 ENCOUNTER — Ambulatory Visit
Admission: RE | Admit: 2021-12-20 | Discharge: 2021-12-20 | Disposition: A | Discharge status: Home / Self Care | Payer: Managed Care, Other (non HMO) | Source: Ambulatory Visit | Attending: Obstetrics and Gynecology | Admitting: Obstetrics and Gynecology

## 2021-12-20 DIAGNOSIS — R1011 Right upper quadrant pain: Secondary | ICD-10-CM

## 2021-12-26 ENCOUNTER — Other Ambulatory Visit (HOSPITAL_BASED_OUTPATIENT_CLINIC_OR_DEPARTMENT_OTHER): Payer: Self-pay

## 2021-12-26 ENCOUNTER — Telehealth: Payer: Self-pay | Admitting: Family Medicine

## 2021-12-26 ENCOUNTER — Emergency Department (HOSPITAL_BASED_OUTPATIENT_CLINIC_OR_DEPARTMENT_OTHER)
Admission: EM | Admit: 2021-12-26 | Discharge: 2021-12-26 | Disposition: A | Discharge status: Home / Self Care | Payer: Managed Care, Other (non HMO) | Attending: Emergency Medicine | Admitting: Emergency Medicine

## 2021-12-26 ENCOUNTER — Emergency Department (HOSPITAL_BASED_OUTPATIENT_CLINIC_OR_DEPARTMENT_OTHER): Payer: Managed Care, Other (non HMO)

## 2021-12-26 ENCOUNTER — Other Ambulatory Visit: Payer: Self-pay

## 2021-12-26 ENCOUNTER — Encounter (HOSPITAL_BASED_OUTPATIENT_CLINIC_OR_DEPARTMENT_OTHER): Payer: Self-pay | Admitting: Emergency Medicine

## 2021-12-26 DIAGNOSIS — R1011 Right upper quadrant pain: Secondary | ICD-10-CM | POA: Diagnosis present

## 2021-12-26 DIAGNOSIS — R197 Diarrhea, unspecified: Secondary | ICD-10-CM | POA: Diagnosis not present

## 2021-12-26 DIAGNOSIS — R1010 Upper abdominal pain, unspecified: Secondary | ICD-10-CM

## 2021-12-26 DIAGNOSIS — N83201 Unspecified ovarian cyst, right side: Secondary | ICD-10-CM | POA: Diagnosis not present

## 2021-12-26 LAB — COMPREHENSIVE METABOLIC PANEL
ALT: 20 U/L (ref 0–44)
AST: 18 U/L (ref 15–41)
Albumin: 4.6 g/dL (ref 3.5–5.0)
Alkaline Phosphatase: 66 U/L (ref 38–126)
Anion gap: 9 (ref 5–15)
BUN: 10 mg/dL (ref 6–20)
CO2: 25 mmol/L (ref 22–32)
Calcium: 9.9 mg/dL (ref 8.9–10.3)
Chloride: 105 mmol/L (ref 98–111)
Creatinine, Ser: 0.86 mg/dL (ref 0.44–1.00)
GFR, Estimated: >60 mL/min (ref >60)
Glucose, Bld: 105 mg/dL — ABNORMAL HIGH (ref 70–99)
Potassium: 3.7 mmol/L (ref 3.5–5.1)
Sodium: 139 mmol/L (ref 135–145)
Total Bilirubin: 0.6 mg/dL (ref 0.3–1.2)
Total Protein: 7.2 g/dL (ref 6.5–8.1)

## 2021-12-26 LAB — URINALYSIS, ROUTINE W REFLEX MICROSCOPIC
Bilirubin Urine: NEGATIVE
Glucose, UA: NEGATIVE mg/dL
Hgb urine dipstick: NEGATIVE
Ketones, ur: NEGATIVE mg/dL
Leukocytes,Ua: NEGATIVE
Nitrite: NEGATIVE
Protein, ur: NEGATIVE mg/dL
Specific Gravity, Urine: 1.011 (ref 1.005–1.030)
pH: 8 (ref 5.0–8.0)

## 2021-12-26 LAB — CBC
HCT: 38.3 % (ref 36.0–46.0)
Hemoglobin: 13.3 g/dL (ref 12.0–15.0)
MCH: 31.4 pg (ref 26.0–34.0)
MCHC: 34.7 g/dL (ref 30.0–36.0)
MCV: 90.5 fL (ref 80.0–100.0)
Platelets: 268 10*3/uL (ref 150–400)
RBC: 4.23 MIL/uL (ref 3.87–5.11)
RDW: 11.7 % (ref 11.5–15.5)
WBC: 8.8 10*3/uL (ref 4.0–10.5)
nRBC: 0 % (ref 0.0–0.2)

## 2021-12-26 LAB — LIPASE, BLOOD: Lipase: 18 U/L (ref 11–51)

## 2021-12-26 LAB — HCG, SERUM, QUALITATIVE: Preg, Serum: NEGATIVE

## 2021-12-26 MED ORDER — ALUM & MAG HYDROXIDE-SIMETH 200-200-20 MG/5ML PO SUSP
30.0000 mL | Freq: Once | ORAL | Status: AC
Start: 2021-12-26 — End: 2021-12-26
  Administered 2021-12-26: 30 mL via ORAL
  Filled 2021-12-26: qty 30

## 2021-12-26 MED ORDER — LIDOCAINE VISCOUS HCL 2 % MT SOLN
15.0000 mL | Freq: Once | OROMUCOSAL | Status: AC
  Administered 2021-12-26: 15 mL via ORAL
  Filled 2021-12-26: qty 15

## 2021-12-26 MED ORDER — SODIUM CHLORIDE 0.9 % IV BOLUS
1000.0000 mL | Freq: Once | INTRAVENOUS | Status: AC
  Administered 2021-12-26: 1000 mL via INTRAVENOUS

## 2021-12-26 MED ORDER — IOHEXOL 350 MG/ML SOLN
100.0000 mL | Freq: Once | INTRAVENOUS | Status: AC | PRN
  Administered 2021-12-26: 70 mL via INTRAVENOUS

## 2021-12-26 MED ORDER — DICYCLOMINE HCL 10 MG PO CAPS
10.0000 mg | ORAL_CAPSULE | Freq: Once | ORAL | Status: AC
  Administered 2021-12-26: 10 mg via ORAL
  Filled 2021-12-26: qty 1

## 2021-12-26 MED ORDER — SUCRALFATE 1 G PO TABS
1.0000 g | ORAL_TABLET | Freq: Three times a day (TID) | ORAL | 0 refills | Status: AC
  Filled 2021-12-26: qty 56, 14d supply, fill #0

## 2021-12-26 MED ORDER — IOHEXOL 9 MG/ML PO SOLN
500.0000 mL | Freq: Once | ORAL | Status: AC | PRN
Start: 2021-12-26 — End: 2021-12-26
  Administered 2021-12-26 (×2): 500 mL via ORAL

## 2021-12-26 NOTE — ED Notes (Signed)
Patient ambulatory to bathroom.

## 2021-12-26 NOTE — Discharge Instructions (Addendum)
Your labs and CT are reassuring here. I think you would benefit from seeing gastroenterology.  ?Please schedule an appointment with gastroenterology. You should also follow up with your PCP regarding your symptoms as well.   ?In the meanwhile, I recommend low acid diet. Try to eat a clear liquid diet for a few days and slowly add back solid foods. I have prescribed you a medication that may help with your symptoms. Please take this as needed.  ?

## 2021-12-26 NOTE — ED Notes (Signed)
EMT-P provided AVS using Teachback Method. Patient verbalizes understanding of Discharge Instructions. Opportunity for Questioning and Answers were provided by EMT-P. Patient Discharged from ED.  ? ?

## 2021-12-26 NOTE — ED Provider Notes (Signed)
?[facility] DEPT ?Provider Note ? ? ?CSN: [id number] ?Arrival date & time: 12/26/21  1245 ? ?  ? ?History ?PMH: PE and DVT ?Chief Complaint  ?Patient presents with  ? Abdominal Pain  ? ? ?Gail Foster is a 36 y.o. female. ?Plan.  She says that she has been experiencing some upper abdominal pain for about a week and a half.  She states that it started after she got back from [place] she says she has a constant aching pain in the right upper quadrant and sometimes that she has to the left upper quadrant.  She does not really think that it goes into her lower quadrants or pelvic region.  She says that this pain has been constant and worse throughout the week. She states that liquid diet helps her symptoms some, but otherwise, she does not notice anything that makes it better or worse.  she has associated diarrhea that is nonbloody.  Diarrhea is only present in the morning.  She says she has not had a normal bowel movement for over a week now.  She denies any fevers or chills her OB/GYN was ordered apparently her gallbladder, liver, and kidneys.  She has not had any pelvic or transvaginal ultrasounds.  Her OB/GYN was not really worried about ovarian patient denies any discharge or change in menstrual cycles.  She denies any chest pain or shortness of breath. she denies any dysuria or hematuria. ? ?Abdominal Pain ?Associated symptoms: diarrhea   ? ?  ? ?Home Medications ?Prior to Admission medications   ?Medication Sig Start Date End Date Taking? Authorizing Provider  ?sucralfate (CARAFATE) 1 g tablet Take 1 tablet (1 gram total) by mouth 4 (four) times daily -  with meals and at bedtime for 14 days. 12/26/21 01/09/22 Yes Ashlinn Hemrick, Adora Fridge, PA-C  ?ALPRAZolam (XANAX) 0.5 MG tablet Take 0.5 mg by mouth 2 (two) times daily as needed for anxiety.  12/28/15   [provider]  ?amphetamine-dextroamphetamine (ADDERALL) 20 MG tablet Take 20 mg by mouth 3 (three) times daily. 03/10/20   [provider]  ?busPIRone (BUSPAR) 15 MG tablet TAKE 1 TABLET BY MOUTH TWICE A DAY 05/05/20   Midge Minium, MD  ?diclofenac (VOLTAREN) 75 MG EC tablet Take 1 tablet (75 mg total) by mouth 2 (two) times daily. 11/16/21   Maximiano Coss, NP  ?methocarbamol (ROBAXIN) 500 MG tablet Take 1 tablet (500 mg total) by mouth 4 (four) times daily as needed for muscle spasms. 11/16/21   Maximiano Coss, NP  ?Multiple Vitamin (MULTIVITAMIN WITH MINERALS) TABS tablet Take 1 tablet by mouth daily.    [provider]  ?norethindrone (MICRONOR,CAMILA,ERRIN) 0.35 MG tablet As directed daily 02/10/16   [provider]  ?propranolol (INDERAL) 20 MG tablet Take 20 mg by mouth as needed. Reported on 02/09/2016 12/28/15   [provider]  ?valACYclovir (VALTREX) 1000 MG tablet as needed.  06/29/16   [provider]  ?   ? ?Allergies    ?Patient has no known allergies.   ? ?Review of Systems   ?Review of Systems  ?Gastrointestinal:  Positive for abdominal pain and diarrhea.  ?All other systems reviewed and are negative. ? ?Physical Exam ?Updated Vital Signs ?BP 129/84   Pulse 64   Temp 98.8 ?F (37.1 ?C)   Resp 18   Ht '5\' 9"'$  (1.753 m)   Wt 97.5 kg   LMP 12/19/2021 (Approximate)   SpO2 99%   BMI 31.75 kg/m?  ?Physical Exam ?  Vitals and nursing note reviewed.  ?Constitutional:   ?   General: She is not in acute distress. ?   Appearance: Normal appearance. She is not ill-appearing, toxic-appearing or diaphoretic.  ?HENT:  ?   Head: Normocephalic and atraumatic.  ?   Nose: No nasal deformity.  ?   Mouth/Throat:  ?   Lips: Pink. No lesions.  ?   Mouth: Mucous membranes are moist. No injury, lacerations, oral lesions or angioedema.  ?   Pharynx: Oropharynx is clear. Uvula midline. No pharyngeal swelling, oropharyngeal exudate, posterior oropharyngeal erythema or uvula swelling.  ?Eyes:  ?   General: Gaze aligned appropriately. No scleral icterus.    ?   Right eye: No discharge.     ?   Left eye: No  discharge.  ?   Conjunctiva/sclera: Conjunctivae normal.  ?   Right eye: Right conjunctiva is not injected. No exudate or hemorrhage. ?   Left eye: Left conjunctiva is not injected. No exudate or hemorrhage. ?   Pupils: Pupils are equal, round, and reactive to light.  ?Cardiovascular:  ?   Rate and Rhythm: Normal rate and regular rhythm.  ?   Pulses: Normal pulses.     ?     Radial pulses are 2+ on the right side and 2+ on the left side.  ?     Dorsalis pedis pulses are 2+ on the right side and 2+ on the left side.  ?   Heart sounds: Normal heart sounds, S1 normal and S2 normal. Heart sounds not distant. No murmur heard. ?  No friction rub. No gallop. No S3 or S4 sounds.  ?Pulmonary:  ?   Effort: Pulmonary effort is normal. No accessory muscle usage or respiratory distress.  ?   Breath sounds: Normal breath sounds. No stridor. No wheezing, rhonchi or rales.  ?Chest:  ?   Chest wall: No tenderness.  ?Abdominal:  ?   General: Abdomen is flat. Bowel sounds are normal. There is no distension.  ?   Palpations: Abdomen is soft. There is no mass or pulsatile mass.  ?   Tenderness: There is abdominal tenderness in the right upper quadrant, epigastric area and left upper quadrant. There is no right CVA tenderness, left CVA tenderness, guarding or rebound. Negative signs include Murphy's sign and McBurney's sign.  ?   Hernia: No hernia is present.  ?Musculoskeletal:  ?   Right lower leg: No edema.  ?   Left lower leg: No edema.  ?Skin: ?   General: Skin is warm and dry.  ?   Coloration: Skin is not jaundiced or pale.  ?   Findings: No bruising, erythema, lesion or rash.  ?Neurological:  ?   General: No focal deficit present.  ?   Mental Status: She is alert and oriented to person, place, and time.  ?   GCS: GCS eye subscore is 4. GCS verbal subscore is 5. GCS motor subscore is 6.  ?Psychiatric:     ?   Mood and Affect: Mood normal.     ?   Behavior: Behavior normal. Behavior is cooperative.  ? ? ?ED Results / Procedures /  Treatments   ?Labs ?(all labs ordered are listed, but only abnormal results are displayed) ?Labs Reviewed  ?COMPREHENSIVE METABOLIC PANEL - Abnormal; Notable for the following components:  ?    Result Value  ? Glucose, Bld 105 (*)   ? All other components within normal limits  ?LIPASE, BLOOD  ?CBC  ?URINALYSIS,  ROUTINE W REFLEX MICROSCOPIC  ?HCG, SERUM, QUALITATIVE  ? ? ?EKG ?None ? ?Radiology ?CT Abdomen Pelvis W Contrast ? ?Result Date: 12/26/2021 ?CLINICAL DATA:  Abdominal pain off and on for 2 weeks, right lower quadrant radiating to the left lower quadrant. Occasional diarrhea. EXAM: CT ABDOMEN AND PELVIS WITH CONTRAST TECHNIQUE: Multidetector CT imaging of the abdomen and pelvis was performed using the standard protocol following bolus administration of intravenous contrast. RADIATION DOSE REDUCTION: This exam was performed according to the departmental dose-optimization program which includes automated exposure control, adjustment of the mA and/or kV according to patient size and/or use of iterative reconstruction technique. CONTRAST:  69m OMNIPAQUE IOHEXOL 350 MG/ML SOLN COMPARISON:  Abdominal ultrasound 12/20/2021. FINDINGS: Lower chest: Lung bases are clear. Heart size normal. No pericardial or pleural effusion. Distal esophagus is unremarkable. There may be a tiny hiatal hernia. Hepatobiliary: Liver and gallbladder are unremarkable. No biliary ductal dilatation. Pancreas: Negative. Spleen: Negative. Adrenals/Urinary Tract: Adrenal glands and kidneys are unremarkable. Ureters are decompressed. Bladder is grossly unremarkable. Stomach/Bowel: Tiny hiatal hernia. Stomach, small bowel, appendix and colon are unremarkable. Vascular/Lymphatic: Vascular structures are unremarkable. No pathologically enlarged lymph nodes. Reproductive: Uterus is visualized. Unilocular right ovarian cystic lesion measures 3.8 x 4.2 cm (2/68). No left adnexal mass. Other: No free fluid.  Mesenteries and peritoneum are unremarkable.  Musculoskeletal: L5-S1 degenerative disc disease. No worrisome lytic or sclerotic lesions. IMPRESSION: 1. No acute findings to explain the patient's pain. 2. Simple-appearing right ovarian cyst. No follow-up imaging recomm

## 2021-12-26 NOTE — Telephone Encounter (Signed)
Has an appt with Tabori on Wednesday  ? ? ?Chief Complaint Abdominal Pain ?Reason for Call Symptomatic / Request for Health Information ?Initial Comment Caller was transferred from the office. Patient has ?side abdominal pain. She is cramping and there is ?concern for bowel obstruction. ?Additional Comment Stomachache is constant and dull ?Translation No ?Nurse Assessment ?Nurse: Ysidro Evert, RN, Levada Dy Date/Time Eilene Ghazi Time): 12/26/2021 9:02:11 AM ?Confirm and document reason for call. If ?symptomatic, describe symptoms. ?---Caller states she is having abdominal pain on the ?right in the middle of her abdomen. It started over a ?week ago. She states she has cramping all the time. ?She only has a small amount watery stool leaking out ?Does the patient have any new or worsening ?symptoms? ---Yes ?Will a triage be completed? ---Yes ?Related visit to physician within the last 2 weeks? ---No ?Does the PT have any chronic conditions? (i.e. ?diabetes, asthma, this includes High risk factors for ?pregnancy, etc.) ?---No ?Is the patient pregnant or possibly pregnant? (Ask ?all females between the ages of 28-55) ---No ?Is this a behavioral health or substance abuse call? ---No ?Guidelines ?Guideline Title Affirmed Question Affirmed Notes Nurse Date/Time (Eastern ?Time) ?Constipation [1] Constant ?abdominal pain AND ?[2] present > 2 hours ?Ysidro Evert, RN, Levada Dy 12/26/2021 9:06:11 ?AM ?PLEASE NOTE: All timestamps contained within this report are represented as [place] Standard Time. ?CONFIDENTIALTY NOTICE: This fax transmission is intended only for the addressee. It contains information that is legally privileged, confidential or ?otherwise protected from use or disclosure. If you are not the intended recipient, you are strictly prohibited from reviewing, disclosing, copying using ?or disseminating any of this information or taking any action in reliance on or regarding this information. If you have received this fax in error,  please ?notify us immediately by telephone so that we can arrange for its return to [place]. Phone: [phone], Toll-Free: [phone], Fax: [phone] ?Page: 2 of 2 ?Call Id: [id number] ?Disp. Time (Eastern ?Time) Disposition Final User ?12/26/2021 9:10:27 AM See HCP within 4 Hours (or ?PCP triage) ?Yes Ysidro Evert, RN, Levada Dy ?Caller Disagree/Comply Comply ?Caller Understands Yes ?PreDisposition Did not know what to do ?Care Advice Given Per Guideline ?SEE HCP (OR PCP TRIAGE) WITHIN 4 HOURS: * IF OFFICE WILL BE OPEN: You need to be seen within the next 3 or 4 ?hours. Call your doctor (or NP/PA) now or as soon as the office opens. NOTHING BY MOUTH: * Do not eat or drink anything for ?now. CALL BACK IF: * You become worse CARE ADVICE given per Constipation (Adult) guideline. ?Referrals ?[facility] - ED ?

## 2021-12-26 NOTE — ED Triage Notes (Signed)
Pt arrives to ED with c/o abdominal pain. This started x2 weeks ago. The pain is located RLQ. The pain radiates to her LLQ. The pain is described as a constant dull ache. Associated symptoms include diarrhea. No nausea, melena, hematochezia, vomiting.  ?

## 2021-12-26 NOTE — Telephone Encounter (Signed)
Pt currently in ED having work up ?

## 2021-12-27 ENCOUNTER — Ambulatory Visit: Payer: Managed Care, Other (non HMO) | Admitting: Family Medicine

## 2021-12-28 ENCOUNTER — Encounter: Payer: Self-pay | Admitting: Family Medicine

## 2021-12-28 ENCOUNTER — Ambulatory Visit (INDEPENDENT_AMBULATORY_CARE_PROVIDER_SITE_OTHER): Payer: Managed Care, Other (non HMO) | Admitting: Family Medicine

## 2021-12-28 VITALS — BP 114/74 | HR 63 | Temp 98.0°F | Resp 16 | Wt 217.6 lb

## 2021-12-28 DIAGNOSIS — R1010 Upper abdominal pain, unspecified: Secondary | ICD-10-CM

## 2021-12-28 NOTE — Patient Instructions (Signed)
Follow up as needed or as scheduled ?Based on your CT images I think there is a large amount of stool in your colon.  This can be quite painful! ?We'll wait and see what GI says about getting things moving, but definitely increase your water intake ?GI advice trumps mine, but consider Miralax twice daily, Dolcolax until things are moving ?Call with any questions or concerns ?Hang in there!!! ?

## 2021-12-28 NOTE — Progress Notes (Signed)
? ?  Subjective:  ? ? Patient ID: Gail Foster, female    DOB: [birth year], 36 y.o.   MRN: [id number] ? ?HPI ?Flank pain- pt saw GYN on 3/20 and had abdominal US, CBC, CMP, and UA.  UA showed small amount of blood (was menstruating) but otherwise labs WNL.  Went to ER on 3/27 for RUQ pain and diarrhea.  Diarrhea was non-bloody and occurred only in the AM.  CT in ER was unremarkable w/ exception of R ovarian cyst.  She was given carafate and deemed stable for d/c. ? ?Sxs started 2 weeks ago as a 'side stitch while working out'.  Sxs then became more frequent and then constant.  She returned from [place] on 3/5.  Pt reports having harder stools and wasn't having normal BMs.  Started taking Miralax but after 3 days had no results.  Then developed diarrhea.  Pain continues today.  Is attempting to eat bland, soft food as this seems better on her stomach.  No N/V.  Area is mildly TTP.  Denies heartburn or reflux.  Some burping.  Last week had fatigue- 'i couldn't make it through a full day of work'.  This has improved.  Started Mag Oxide pills w/o results. ? ?Has GI appt at 2:30 today ? ? ?Review of Systems ?For ROS see HPI  ?   ?Objective:  ? Physical Exam ?Vitals reviewed.  ?Constitutional:   ?   General: She is not in acute distress. ?   Appearance: Normal appearance. She is not ill-appearing.  ?Abdominal:  ?   General: There is distension (mild).  ?   Tenderness: There is no abdominal tenderness. There is no guarding or rebound.  ?Skin: ?   General: Skin is warm and dry.  ?Neurological:  ?   General: No focal deficit present.  ?   Mental Status: She is alert and oriented to person, place, and time.  ?Psychiatric:     ?   Mood and Affect: Mood normal.     ?   Behavior: Behavior normal.     ?   Thought Content: Thought content normal.  ? ? ? ? ? ?   ?Assessment & Plan:  ?Upper abd pain- new.  Reviewed pt's labs and CT.  Reviewed Dr Julien Girt notes, ER note.  Actually pulled up the CT images to review with her.  To my untrained  eye, there appears to be a lot of stool in the colon.  This was not mentioned in the report.  She states that prior to leaving for [place] she had back pain and was placed on pain medication.  Then she traveled.  Feels that she has not moved bowels normally in 'a long time'.  Suspect that the diarrhea is actually stooling around a hard stool ball.  Encouraged Miralax BID plus Dulcolax as needed.  She sees GI this afternoon and their advice trumps mine but we discussed medications and lifestyle changes that can improve constipation.  Pt appreciative for advice and review of the situation. ? ?

## 2022-03-23 ENCOUNTER — Encounter: Payer: Self-pay | Admitting: Family Medicine

## 2022-06-17 ENCOUNTER — Telehealth: Payer: Managed Care, Other (non HMO) | Admitting: Physician Assistant

## 2022-06-17 DIAGNOSIS — J019 Acute sinusitis, unspecified: Secondary | ICD-10-CM

## 2022-06-17 DIAGNOSIS — B9689 Other specified bacterial agents as the cause of diseases classified elsewhere: Secondary | ICD-10-CM

## 2022-06-17 MED ORDER — AMOXICILLIN-POT CLAVULANATE 875-125 MG PO TABS: 1.0000 | ORAL_TABLET | Freq: Two times a day (BID) | ORAL | 0 refills | Status: DC

## 2022-06-17 NOTE — Progress Notes (Signed)

## 2022-10-05 ENCOUNTER — Telehealth: Payer: Managed Care, Other (non HMO) | Admitting: Physician Assistant

## 2022-10-05 DIAGNOSIS — B9689 Other specified bacterial agents as the cause of diseases classified elsewhere: Secondary | ICD-10-CM

## 2022-10-05 DIAGNOSIS — J019 Acute sinusitis, unspecified: Secondary | ICD-10-CM

## 2022-10-05 MED ORDER — AMOXICILLIN-POT CLAVULANATE 875-125 MG PO TABS: 1.0000 | ORAL_TABLET | Freq: Two times a day (BID) | ORAL | 0 refills | Status: DC

## 2022-10-05 NOTE — Progress Notes (Signed)

## 2022-10-12 MED ORDER — DOXYCYCLINE HYCLATE 100 MG PO TABS: 100.0000 mg | ORAL_TABLET | Freq: Two times a day (BID) | ORAL | 0 refills | Status: DC

## 2022-10-12 MED ORDER — FLUTICASONE PROPIONATE 50 MCG/ACT NA SUSP: 2.0000 | Freq: Every day | NASAL | 0 refills | Status: DC

## 2022-10-12 MED ORDER — PREDNISONE 20 MG PO TABS: 20.0000 mg | ORAL_TABLET | Freq: Every day | ORAL | 0 refills | Status: AC

## 2022-10-12 NOTE — Addendum Note (Signed)
Addended by: Geryl Rankins on: 10/12/2022 06:19 PM   Modules accepted: Orders

## 2022-10-12 NOTE — Addendum Note (Signed)
Addended by: Evelina Dun A on: 10/12/2022 06:14 PM   Modules accepted: Orders

## 2022-11-03 ENCOUNTER — Other Ambulatory Visit: Payer: Self-pay | Admitting: Nurse Practitioner

## 2023-08-03 DIAGNOSIS — M25572 Pain in left ankle and joints of left foot: Secondary | ICD-10-CM | POA: Diagnosis not present

## 2023-08-03 DIAGNOSIS — M25552 Pain in left hip: Secondary | ICD-10-CM | POA: Diagnosis not present

## 2023-08-03 DIAGNOSIS — M9904 Segmental and somatic dysfunction of sacral region: Secondary | ICD-10-CM | POA: Diagnosis not present

## 2023-08-03 DIAGNOSIS — M9905 Segmental and somatic dysfunction of pelvic region: Secondary | ICD-10-CM | POA: Diagnosis not present

## 2023-08-03 DIAGNOSIS — M9902 Segmental and somatic dysfunction of thoracic region: Secondary | ICD-10-CM | POA: Diagnosis not present

## 2023-08-03 DIAGNOSIS — M25562 Pain in left knee: Secondary | ICD-10-CM | POA: Diagnosis not present

## 2023-08-03 DIAGNOSIS — M9903 Segmental and somatic dysfunction of lumbar region: Secondary | ICD-10-CM | POA: Diagnosis not present

## 2023-09-14 DIAGNOSIS — M9904 Segmental and somatic dysfunction of sacral region: Secondary | ICD-10-CM | POA: Diagnosis not present

## 2023-09-14 DIAGNOSIS — M9903 Segmental and somatic dysfunction of lumbar region: Secondary | ICD-10-CM | POA: Diagnosis not present

## 2023-09-14 DIAGNOSIS — R29898 Other symptoms and signs involving the musculoskeletal system: Secondary | ICD-10-CM | POA: Diagnosis not present

## 2023-09-14 DIAGNOSIS — M9905 Segmental and somatic dysfunction of pelvic region: Secondary | ICD-10-CM | POA: Diagnosis not present

## 2023-09-14 DIAGNOSIS — M25552 Pain in left hip: Secondary | ICD-10-CM | POA: Diagnosis not present

## 2023-09-14 DIAGNOSIS — M25562 Pain in left knee: Secondary | ICD-10-CM | POA: Diagnosis not present

## 2023-09-14 DIAGNOSIS — M9902 Segmental and somatic dysfunction of thoracic region: Secondary | ICD-10-CM | POA: Diagnosis not present

## 2023-09-20 DIAGNOSIS — F4010 Social phobia, unspecified: Secondary | ICD-10-CM | POA: Diagnosis not present

## 2023-09-20 DIAGNOSIS — F411 Generalized anxiety disorder: Secondary | ICD-10-CM | POA: Diagnosis not present

## 2023-09-20 DIAGNOSIS — F900 Attention-deficit hyperactivity disorder, predominantly inattentive type: Secondary | ICD-10-CM | POA: Diagnosis not present

## 2023-10-26 DIAGNOSIS — M9906 Segmental and somatic dysfunction of lower extremity: Secondary | ICD-10-CM | POA: Diagnosis not present

## 2023-10-26 DIAGNOSIS — M9908 Segmental and somatic dysfunction of rib cage: Secondary | ICD-10-CM | POA: Diagnosis not present

## 2023-10-26 DIAGNOSIS — M25562 Pain in left knee: Secondary | ICD-10-CM | POA: Diagnosis not present

## 2023-10-26 DIAGNOSIS — M9903 Segmental and somatic dysfunction of lumbar region: Secondary | ICD-10-CM | POA: Diagnosis not present

## 2023-10-26 DIAGNOSIS — M25552 Pain in left hip: Secondary | ICD-10-CM | POA: Diagnosis not present

## 2023-10-26 DIAGNOSIS — M9902 Segmental and somatic dysfunction of thoracic region: Secondary | ICD-10-CM | POA: Diagnosis not present

## 2023-11-20 ENCOUNTER — Telehealth: Payer: BC Managed Care – PPO | Admitting: Physician Assistant

## 2023-11-20 DIAGNOSIS — B9689 Other specified bacterial agents as the cause of diseases classified elsewhere: Secondary | ICD-10-CM | POA: Diagnosis not present

## 2023-11-20 DIAGNOSIS — J019 Acute sinusitis, unspecified: Secondary | ICD-10-CM | POA: Diagnosis not present

## 2023-11-20 MED ORDER — AMOXICILLIN-POT CLAVULANATE 875-125 MG PO TABS: 1.0000 | ORAL_TABLET | Freq: Two times a day (BID) | ORAL | 0 refills | Status: AC

## 2023-11-20 NOTE — Progress Notes (Signed)

## 2023-12-01 ENCOUNTER — Encounter

## 2023-12-01 ENCOUNTER — Telehealth: Admitting: Family Medicine

## 2023-12-01 DIAGNOSIS — B9689 Other specified bacterial agents as the cause of diseases classified elsewhere: Secondary | ICD-10-CM

## 2023-12-01 NOTE — Progress Notes (Signed)
  Because Gail Foster, I feel your condition warrants further evaluation and I recommend that you be seen in a face-to-face visit.   NOTE: There will be NO CHARGE for this E-Visit   If you are having a true medical emergency, please call 911.     For an urgent face to face visit, [facility] has multiple urgent care centers for your convenience.  Click the link below for the full list of locations and hours, walk-in wait times, appointment scheduling options and driving directions:  Urgent Care - [place], [place], [place], [place], [place], [place]  [place]     Your MyChart E-visit questionnaire answers were reviewed by a board certified advanced clinical practitioner to complete your personal care plan based on your specific symptoms.    Thank you for using e-Visits.

## 2023-12-21 DIAGNOSIS — M25552 Pain in left hip: Secondary | ICD-10-CM | POA: Diagnosis not present

## 2023-12-21 DIAGNOSIS — M9903 Segmental and somatic dysfunction of lumbar region: Secondary | ICD-10-CM | POA: Diagnosis not present

## 2023-12-21 DIAGNOSIS — M25551 Pain in right hip: Secondary | ICD-10-CM | POA: Diagnosis not present

## 2023-12-21 DIAGNOSIS — M9906 Segmental and somatic dysfunction of lower extremity: Secondary | ICD-10-CM | POA: Diagnosis not present

## 2023-12-21 DIAGNOSIS — M5459 Other low back pain: Secondary | ICD-10-CM | POA: Diagnosis not present

## 2023-12-21 DIAGNOSIS — M9904 Segmental and somatic dysfunction of sacral region: Secondary | ICD-10-CM | POA: Diagnosis not present

## 2023-12-21 DIAGNOSIS — M9902 Segmental and somatic dysfunction of thoracic region: Secondary | ICD-10-CM | POA: Diagnosis not present

## 2024-02-01 DIAGNOSIS — M5459 Other low back pain: Secondary | ICD-10-CM | POA: Diagnosis not present

## 2024-02-01 DIAGNOSIS — M9902 Segmental and somatic dysfunction of thoracic region: Secondary | ICD-10-CM | POA: Diagnosis not present

## 2024-02-01 DIAGNOSIS — M25552 Pain in left hip: Secondary | ICD-10-CM | POA: Diagnosis not present

## 2024-02-01 DIAGNOSIS — M9903 Segmental and somatic dysfunction of lumbar region: Secondary | ICD-10-CM | POA: Diagnosis not present

## 2024-02-01 DIAGNOSIS — M791 Myalgia, unspecified site: Secondary | ICD-10-CM | POA: Diagnosis not present

## 2024-02-01 DIAGNOSIS — M9904 Segmental and somatic dysfunction of sacral region: Secondary | ICD-10-CM | POA: Diagnosis not present

## 2024-02-01 DIAGNOSIS — M9905 Segmental and somatic dysfunction of pelvic region: Secondary | ICD-10-CM | POA: Diagnosis not present

## 2024-02-01 DIAGNOSIS — M25562 Pain in left knee: Secondary | ICD-10-CM | POA: Diagnosis not present

## 2024-03-03 DIAGNOSIS — Z6828 Body mass index (BMI) 28.0-28.9, adult: Secondary | ICD-10-CM | POA: Diagnosis not present

## 2024-03-03 DIAGNOSIS — Z01419 Encounter for gynecological examination (general) (routine) without abnormal findings: Secondary | ICD-10-CM | POA: Diagnosis not present

## 2024-03-06 DIAGNOSIS — F900 Attention-deficit hyperactivity disorder, predominantly inattentive type: Secondary | ICD-10-CM | POA: Diagnosis not present

## 2024-03-06 DIAGNOSIS — F4010 Social phobia, unspecified: Secondary | ICD-10-CM | POA: Diagnosis not present

## 2024-03-06 DIAGNOSIS — F411 Generalized anxiety disorder: Secondary | ICD-10-CM | POA: Diagnosis not present

## 2024-05-09 DIAGNOSIS — M25572 Pain in left ankle and joints of left foot: Secondary | ICD-10-CM | POA: Diagnosis not present

## 2024-05-09 DIAGNOSIS — M25562 Pain in left knee: Secondary | ICD-10-CM | POA: Diagnosis not present

## 2024-05-09 DIAGNOSIS — M25571 Pain in right ankle and joints of right foot: Secondary | ICD-10-CM | POA: Diagnosis not present

## 2024-05-09 DIAGNOSIS — M9906 Segmental and somatic dysfunction of lower extremity: Secondary | ICD-10-CM | POA: Diagnosis not present

## 2024-05-09 DIAGNOSIS — M25552 Pain in left hip: Secondary | ICD-10-CM | POA: Diagnosis not present

## 2024-09-06 DIAGNOSIS — F411 Generalized anxiety disorder: Secondary | ICD-10-CM | POA: Diagnosis not present

## 2024-09-06 DIAGNOSIS — F4010 Social phobia, unspecified: Secondary | ICD-10-CM | POA: Diagnosis not present

## 2024-09-06 DIAGNOSIS — F900 Attention-deficit hyperactivity disorder, predominantly inattentive type: Secondary | ICD-10-CM | POA: Diagnosis not present
